# Patient Record
Sex: Male | Born: 1955 | Race: Black or African American | Hispanic: No | Marital: Single | Smoking: Current every day smoker
Health system: Southern US, Community
[De-identification: ages and names within clinical notes are randomized; demographics above are authoritative.]

## PROBLEM LIST (undated history)

## (undated) DIAGNOSIS — I10 Essential (primary) hypertension: Secondary | ICD-10-CM

## (undated) DIAGNOSIS — R9439 Abnormal result of other cardiovascular function study: Secondary | ICD-10-CM

---

## 1999-08-01 NOTE — ED Provider Notes (Signed)
[facility]                      EMERGENCY DEPARTMENT TREATMENT REPORT   NAME:  Cesar Todd, Cesar Todd   MR #:  [id number]   BILLING #: [id number]        DOS: 08/01/1999  TIME: 8:29 P   cc:   Primary Physician:   CHIEF COMPLAINT:  Recheck of left eye.   HISTORY OF PRESENT ILLNESS:  This is a 44 year old male who was seen in the   Emergency Department on 07/12 for apparent corneal abrasion of his left   eye.  He thought it might have been related to chemical exposure at work,   he does work for [employer], although he does not remember getting anything in   his eye, it just started hurting.  He was seen here and was diagnosed with   a corneal abrasion and put on Ilotycin ointment.  He was suppose to come   back for a recheck four days ago but was not able to get in at that time.   He presents now.  He states his eye is much better, he is able to see out   of it, it does not particularly hurt him at this time, and needs a note to   return to work.   REVIEW OF SYSTEMS:  See HPI, otherwise negative.  Denies any photophobia,   no drainage from the eye, has been using his ointment.   PHYSICAL EXAMINATION:   EYES:  The left eye is mildly injected.  Cornea is clear.  Pupil equal,   round, and reactive to light.   PROCEDURE NOTE:  Slit lamp was done.  Fluorescein stain was instilled and   revealed a small areas of stippling at about ten o'clock on the edge of the   cornea.  There was no foreign body present.  The anterior chamber is clear.   Visual acuity right eye 20/20; left eye 20/50.   IMPRESSION/MANAGEMENT PLAN:  This is a 44 year old male for recheck of his   left eye, still has some evidence of injury present.  He does need to   follow up with an ophthalmologist and he was strongly urged to so do by   myself and Dr. Katharine Look.  He will continue with the Ilotycin ointment.   He was given the name of Dr. Windell Moment and a phone number to call tomorrow   morning.   FINAL DIAGNOSIS:    1.  Resolving corneal abrasion.   DISPOSITION:  The patient was discharged home in stable condition, to   follow up as above.  The patient was evaluated by myself and Dr. Katharine Look, who agrees with the above assessment and plan.   Electronically Signed By:   Docia Furl, M.D. 08/06/1999 04:34   ____________________________   Docia Furl, M.D.   cd D:  08/01/1999 T:  08/04/1999  7:20 P   [id number]   Fara Chute, PA

## 1999-08-01 NOTE — ED Provider Notes (Signed)
[facility]                      EMERGENCY DEPARTMENT TREATMENT REPORT   NAME:  TESLA, BOCHICCHIO   MR #:  [id number]   BILLING #: [id number]        DOS: 07/27/1999  TIME: 3:33 P   cc:   Primary Physician:   The patient was seen at 1515 hours.   CHIEF COMPLAINT:  Left eye pain.   HISTORY OF PRESENT ILLNESS:  Mr. Solem is a 44 year old gentleman with a   three-day history of worsening pain to his left eye.  He has become very   photophobic.  The eye itself has become red.  He denies any visual changes,   headache, nausea, vomiting, fevers or chills.  He works for [employer].  He is afraid that some of the chemicals may have blown into his   face, but he denies any true exposure to chemicals.  He states that his   left eyelid has been swollen over the last day.   REVIEW OF SYSTEMS:   CONSTITUTIONAL:  No fever, chills, weight loss.   ENT: No sore throat, runny nose or other URI symptoms.   ENDOCRINE:  No diabetic symptoms.   NEUROLOGICAL:  No headaches, sensory or motor symptoms.   PAST MEDICAL HISTORY:   Negative.   MEDICATIONS:   Negative.   ALLERGIES:  Negative.   SOCIAL HISTORY:  He works for [employer].   PHYSICAL EXAMINATION:   VITAL SIGNS:  Blood pressure 136/80, pulse 90, respirations 18, temperature   98.5.   GENERAL APPEARANCE:  The patient appears well-developed and well-nourished.   Appearance and behavior are age and situation appropriate.   HEENT:  Eyes-Left conjunctiva is injected, right is normal.  The left upper   eyelid is edematous, but not erythematous.  The right lids are normal.   Pupils are equal, symmetric, and normally reactive bilaterally.  There is   white discharge at the left medial canthus.   Fundi:  Optic discs are   normal in appearance, no gross hemorrhages or exudates seen.     Ears/Nose:   Hearing is grossly intact to voice.  Internal and external examinations of   the ears are unremarkable.     Mouth/Throat:  Surfaces of the pharynx,    palate, and tongue are pink, moist, and without lesions.   NECK:  Supple, nontender, symmetrical, no masses or JVD, trachea midline,   thyroid not enlarged, nodular, or tender.   LYMPHATICS:   No cervical or submandibular lymphadenopathy palpated.   Slit lamp examination shows a large corneal abrasion over the left cornea   centrally with fluorescein uptake, no foreign bodies.   IMPRESSION/MANAGEMENT PLAN:   This is a new problem for this patient.   The   patient with a left corneal abrasion.  No foreign body on examination.   FINAL DIAGNOSIS:  Left eye corneal abrasion.   DISPOSITION:   The patient is discharged to home.  He is to return to the   Emergency Department if the eye is not significantly improved in 24 hours.   He was given a prescription for Ilotycin, 4 times a day for 5 days, and   Vicodin for pain, 1-2 q4-6h as needed for pain.   Electronically Signed By:   Octavia Bruckner, M.D. 08/05/1999 22:44   ____________________________   Octavia Bruckner, M.D.   jb D:  07/27/1999 T:  08/01/1999  8:56 P   [id number]

## 2008-12-22 MED ORDER — TRAMADOL 50 MG TAB
50 mg | ORAL_TABLET | Freq: Three times a day (TID) | ORAL | Status: AC | PRN
Start: 2008-12-22 — End: 2009-01-01

## 2008-12-22 NOTE — Progress Notes (Signed)
Fx wrist 12/17/2008.  In splint awaiting ortho appointment.  Out of pain med

## 2008-12-22 NOTE — Progress Notes (Signed)
Subjective:  Fractured wrist     PMH/PSH/SH/FH/Allergies and medications reviewed and noted in medical record.    Review of Systems - General ROS: negative  Musculoskeletal ROS: positive for - pain in arm - left and wrist - left    Objective:   BP 120/80   Pulse 68   Temp 97.7 ??F (36.5 ??C)   Resp 18    Physical Examination: General appearance - alert, well appearing, and in no distress and normal appearing weight  Mental status - alert, oriented to person, place, and time, normal mood, behavior, speech, dress, motor activity, and thought processes  Musculoskeletal - abnormal exam of left arm, arm is in a sling and is well immobilized  Assessment/ Plan:   Cesar Todd was seen today for wrist injury.    Diagnoses and associated orders for this visit:    Wrist fracture, closed    Ed (erectile dysfunction)    Other Orders  - tramadol (ULTRAM) 50 mg tablet; Take 1 Tab by mouth every eight (8) hours as needed for Pain for 10 days.        Medications ordered as noted above and risks/side effects/benefits discussed. Diagnosis and condition explained to patient and questions answered. Literature provided as noted above. Follow-up Disposition:  Return if symptoms worsen or fail to improve.

## 2009-01-05 MED ORDER — TRAMADOL 50 MG TAB
50 mg | ORAL_TABLET | Freq: Four times a day (QID) | ORAL | Status: AC | PRN
Start: 2009-01-05 — End: 2009-01-15

## 2009-01-05 NOTE — Progress Notes (Signed)
Subjective:  Fractured wrist     PMH/PSH/SH/FH/Allergies and medications reviewed and noted in medical record.    Review of Systems - General ROS: negative  Musculoskeletal ROS: positive for - pain in arm - left and wrist - left    Objective:   BP 128/82   Pulse 64   Resp 18    Physical Examination: General appearance - alert, well appearing, and in no distress and normal appearing weight  Mental status - alert, oriented to person, place, and time, normal mood, behavior, speech, dress, motor activity, and thought processes  Musculoskeletal - abnormal exam of left arm, arm is in a sling and is well immobilized  Assessment/ Plan:   Cesar Todd was seen today for wrist pain.    Diagnoses and associated orders for this visit:    Fracture of wrist  - tramadol (ULTRAM) 50 mg tablet; Take 1 Tab by mouth every six (6) hours as needed for Pain for 10 days.        Medications ordered as noted above and risks/side effects/benefits discussed. Diagnosis and condition explained to patient and questions answered. Literature provided as noted above. Follow-up Disposition: Not on File                         Subjective:  Pain in left wrist     PMH/PSH/SH/FH/Allergies and medications reviewed and noted in medical record.    Review of Systems - History obtained from the patient  General ROS: negative  Musculoskeletal ROS: positive for - fracture of the left wrist    Objective:   BP 128/82   Pulse 64   Resp 18    Physical Examination: General appearance - alert, well appearing, and in no distress, oriented to person, place, and time and normal appearing weight  Mental status - alert, oriented to person, place, and time, normal mood, behavior, speech, dress, motor activity, and thought processes  Musculoskeletal - cast to the left wrist.  Circulation is good.  Assessment/ Plan:   Cesar Todd was seen today for wrist pain.    Diagnoses and associated orders for this visit:    Fracture of wrist   - tramadol (ULTRAM) 50 mg tablet; Take 1 Tab by mouth every six (6) hours as needed for Pain for 10 days.        Medications ordered as noted above and risks/side effects/benefits discussed. Diagnosis and condition explained to patient and questions answered. Literature provided as noted above. Follow-up Disposition: Not on File

## 2009-01-05 NOTE — Progress Notes (Signed)
Requesting pain medication

## 2013-01-05 ENCOUNTER — Encounter

## 2013-02-05 NOTE — Progress Notes (Signed)
Did not call patient because of long distance number. I don not have long distance code at this time.

## 2017-05-20 ENCOUNTER — Emergency Department (HOSPITAL_COMMUNITY)
Admission: EM | Admit: 2017-05-20 | Discharge: 2017-05-20 | Disposition: A | Discharge status: Home / Self Care | Payer: Medicare Other | Attending: Emergency Medicine | Admitting: Emergency Medicine

## 2017-05-20 ENCOUNTER — Encounter (HOSPITAL_COMMUNITY): Payer: Self-pay

## 2017-05-20 ENCOUNTER — Other Ambulatory Visit: Payer: Self-pay

## 2017-05-20 DIAGNOSIS — M26602 Left temporomandibular joint disorder, unspecified: Secondary | ICD-10-CM | POA: Diagnosis not present

## 2017-05-20 DIAGNOSIS — M26622 Arthralgia of left temporomandibular joint: Secondary | ICD-10-CM

## 2017-05-20 DIAGNOSIS — R6884 Jaw pain: Secondary | ICD-10-CM | POA: Diagnosis present

## 2017-05-20 NOTE — ED Notes (Signed)
This NT brought pt back to room, pt communicated frustration with wait time and expressed that he wanted to leave or be seen by ED provider immediately. This NT explained to pt that the EDP would be with him as soon as possible. Pt encouraged to stay and chose to stay. Pt refused to change into gown and is resting in bed at this time.

## 2017-05-20 NOTE — ED Triage Notes (Signed)
Patient complains of left temporal headache x 2 weeks, denies trauma. denies blurred vision, no nausea, alert and oriented.

## 2017-05-20 NOTE — ED Provider Notes (Signed)
[facility] EMERGENCY DEPARTMENT Provider Note  CSN: [id number] Arrival date & time: 05/20/17 4098  Chief Complaint(s) Headache  HPI Curtis Griffin is a 62 y.o. male with no pertinent past medical history presents to the emergency department with left jaw pain for 2 weeks that was gradual onset and worsening since onset.   pain is aching in nature.  Exacerbated with chewing and biting down with the back of his teeth.  This is new for the patient.  Denies any fevers or chills.  No nausea or vomiting.  No abdominal pain.  No chest pain or shortness of breath.  Denies any ear pain.  Denies any dental pain.  HPI  Past Medical History History reviewed. No pertinent past medical history. There are no active problems to display for this patient.  Home Medication(s) Prior to Admission medications   Not on File                                                                                                                                    Past Surgical History History reviewed. No pertinent surgical history. Family History No family history on file.  Social History Social History   Tobacco Use  . Smoking status: Not on file  Substance Use Topics  . Alcohol use: Not on file  . Drug use: Not on file   Allergies Patient has no known allergies.  Review of Systems Review of Systems All other systems are reviewed and are negative for acute change except as noted in the HPI  Physical Exam Vital Signs  I have reviewed the triage vital signs BP (!) 134/94 (BP Location: Right Arm)   Pulse 87   Temp 98.1 F (36.7 C) (Oral)   Resp 18   SpO2 99%   Physical Exam  Constitutional: He is oriented to person, place, and time. He appears well-developed and well-nourished. No distress.  HENT:  Head: Normocephalic and atraumatic.    Right Ear: Tympanic membrane and external ear normal.  Left Ear: Tympanic membrane and external ear normal.  Nose: Nose normal.    Mouth/Throat: Mucous membranes are normal. No trismus in the jaw. Abnormal dentition.  No dental TTP. No temporal TTP  Eyes: Conjunctivae and EOM are normal. No scleral icterus.  Neck: Normal range of motion and phonation normal.  Cardiovascular: Normal rate and regular rhythm.  Pulmonary/Chest: Effort normal. No stridor. No respiratory distress.  Abdominal: He exhibits no distension.  Musculoskeletal: Normal range of motion. He exhibits no edema.  Neurological: He is alert and oriented to person, place, and time. He has normal strength. No cranial nerve deficit or sensory deficit.  Skin: He is not diaphoretic.  Psychiatric: He has a normal mood and affect. His behavior is normal.  Vitals reviewed.   ED Results and Treatments Labs (all labs ordered are listed, but only abnormal results are displayed) Labs Reviewed - No data to display  EKG  EKG Interpretation  Date/Time:    Ventricular Rate:    PR Interval:    QRS Duration:   QT Interval:    QTC Calculation:   R Axis:     Text Interpretation:        Radiology No results found. Pertinent labs & imaging results that were available during my care of the patient were reviewed by me and considered in my medical decision making (see chart for details).  Medications Ordered in ED Medications - No data to display                                                                                                                                  Procedures Procedures  (including critical care time)  Medical Decision Making / ED Course I have reviewed the nursing notes for this encounter and the patient's prior records (if available in EHR or on provided paperwork).     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  No evidence of dental etiology.  No evidence of acute otitis media.  Not suspicious for temporal  arteritis.  Non focal neuro exam. No recent head trauma. No fever. Doubt meningitis. Doubt intracranial bleed. Doubt IIH. No indication for imaging.   Patient is already on NSAIDs and muscle relaxers for chronic back pain.  Recommended continue use and [facility] either [facility] or [facility]y.    Final Clinical Impression(s) / ED Diagnoses Final diagnoses:  TMJ tenderness, left   Disposition: Discharge  Condition: Good  I have discussed the results, Dx and Tx plan with the patient who expressed understanding and agree(s) with the plan. Discharge instructions discussed at great length. The patient was given strict return precautions who verbalized understanding of the instructions. No further questions at time of discharge.    ED Discharge Orders    None       Follow Up: [facility] [address] [place] [address] [phone]  Schedule an appointment as soon as possible for a visit  in 5-7 days, If symptoms do not improve or  worsen      This chart was dictated using voice recognition software.  Despite best efforts to proofread,  errors can occur which can change the documentation meaning.   Nira Conn, MD 05/20/17 [phone]

## 2017-05-20 NOTE — ED Notes (Signed)
Discussed discharge with pt ; no questions , understands to follow up as ordered

## 2017-09-24 ENCOUNTER — Emergency Department (HOSPITAL_COMMUNITY): Payer: Medicare Other

## 2017-09-24 ENCOUNTER — Encounter (HOSPITAL_COMMUNITY): Payer: Self-pay | Admitting: Emergency Medicine

## 2017-09-24 ENCOUNTER — Encounter (HOSPITAL_COMMUNITY): Payer: Self-pay | Admitting: *Deleted

## 2017-09-24 ENCOUNTER — Emergency Department (HOSPITAL_COMMUNITY)
Admission: EM | Admit: 2017-09-24 | Discharge: 2017-09-24 | Disposition: A | Discharge status: Home / Self Care | Payer: Medicare Other | Attending: Emergency Medicine | Admitting: Emergency Medicine

## 2017-09-24 ENCOUNTER — Ambulatory Visit (INDEPENDENT_AMBULATORY_CARE_PROVIDER_SITE_OTHER)
Admission: EM | Admit: 2017-09-24 | Discharge: 2017-09-24 | Disposition: A | Discharge status: Home / Self Care | Payer: Medicare Other | Source: Home / Self Care | Attending: Family Medicine | Admitting: Family Medicine

## 2017-09-24 ENCOUNTER — Other Ambulatory Visit: Payer: Self-pay

## 2017-09-24 DIAGNOSIS — Y929 Unspecified place or not applicable: Secondary | ICD-10-CM | POA: Diagnosis not present

## 2017-09-24 DIAGNOSIS — S0993XA Unspecified injury of face, initial encounter: Secondary | ICD-10-CM | POA: Diagnosis not present

## 2017-09-24 DIAGNOSIS — H1132 Conjunctival hemorrhage, left eye: Secondary | ICD-10-CM | POA: Insufficient documentation

## 2017-09-24 DIAGNOSIS — Y999 Unspecified external cause status: Secondary | ICD-10-CM | POA: Diagnosis not present

## 2017-09-24 DIAGNOSIS — S0512XA Contusion of eyeball and orbital tissues, left eye, initial encounter: Secondary | ICD-10-CM

## 2017-09-24 DIAGNOSIS — S0592XA Unspecified injury of left eye and orbit, initial encounter: Secondary | ICD-10-CM | POA: Diagnosis not present

## 2017-09-24 DIAGNOSIS — H5712 Ocular pain, left eye: Secondary | ICD-10-CM | POA: Diagnosis not present

## 2017-09-24 DIAGNOSIS — S058X2A Other injuries of left eye and orbit, initial encounter: Secondary | ICD-10-CM

## 2017-09-24 DIAGNOSIS — R7989 Other specified abnormal findings of blood chemistry: Secondary | ICD-10-CM

## 2017-09-24 DIAGNOSIS — Y939 Activity, unspecified: Secondary | ICD-10-CM | POA: Diagnosis not present

## 2017-09-24 DIAGNOSIS — R51 Headache: Secondary | ICD-10-CM | POA: Diagnosis present

## 2017-09-24 LAB — BASIC METABOLIC PANEL
ANION GAP: 13 (ref 5–15)
BUN: 21 mg/dL — ABNORMAL HIGH (ref 6–20)
CHLORIDE: 108 mmol/L (ref 98–111)
CO2: 24 mmol/L (ref 22–32)
CREATININE: 2.85 mg/dL — AB (ref 0.61–1.24)
Calcium: 9.5 mg/dL (ref 8.9–10.3)
GFR calc non Af Amer: 23 mL/min — ABNORMAL LOW (ref >60)
GFR, EST AFRICAN AMERICAN: 26 mL/min — AB (ref >60)
Glucose, Bld: 98 mg/dL (ref 70–99)
POTASSIUM: 4.1 mmol/L (ref 3.5–5.1)
Sodium: 145 mmol/L (ref 135–145)

## 2017-09-24 LAB — CBC
HEMATOCRIT: 46.4 % (ref 39.0–52.0)
Hemoglobin: 15.1 g/dL (ref 13.0–17.0)
MCH: 29.1 pg (ref 26.0–34.0)
MCHC: 32.5 g/dL (ref 30.0–36.0)
MCV: 89.4 fL (ref 78.0–100.0)
PLATELETS: 275 10*3/uL (ref 150–400)
RBC: 5.19 MIL/uL (ref 4.22–5.81)
RDW: 13.3 % (ref 11.5–15.5)
WBC: 6.5 10*3/uL (ref 4.0–10.5)

## 2017-09-24 LAB — URINALYSIS, ROUTINE W REFLEX MICROSCOPIC
Bilirubin Urine: NEGATIVE
Glucose, UA: NEGATIVE mg/dL
Ketones, ur: NEGATIVE mg/dL
Leukocytes, UA: NEGATIVE
Nitrite: NEGATIVE
PROTEIN: NEGATIVE mg/dL
SPECIFIC GRAVITY, URINE: 1.004 — AB (ref 1.005–1.030)
pH: 5 (ref 5.0–8.0)

## 2017-09-24 MED ORDER — OXYCODONE HCL 5 MG PO TABS: 5.0000 mg | ORAL_TABLET | Freq: Four times a day (QID) | ORAL | 0 refills | Status: DC | PRN

## 2017-09-24 MED ORDER — ERYTHROMYCIN 5 MG/GM OP OINT
1.0000 "application " | TOPICAL_OINTMENT | Freq: Once | OPHTHALMIC | Status: AC
  Administered 2017-09-24: 1 via OPHTHALMIC
  Filled 2017-09-24: qty 3.5

## 2017-09-24 MED ORDER — FLUORESCEIN SODIUM 1 MG OP STRP
1.0000 | ORAL_STRIP | Freq: Once | OPHTHALMIC | Status: AC
  Administered 2017-09-24: 1 via OPHTHALMIC
  Filled 2017-09-24: qty 1

## 2017-09-24 MED ORDER — SODIUM CHLORIDE 0.9 % IV BOLUS
1000.0000 mL | Freq: Once | INTRAVENOUS | Status: AC
  Administered 2017-09-24: 1000 mL via INTRAVENOUS

## 2017-09-24 MED ORDER — MORPHINE SULFATE (PF) 4 MG/ML IV SOLN
4.0000 mg | Freq: Once | INTRAVENOUS | Status: AC
  Administered 2017-09-24: 4 mg via INTRAVENOUS
  Filled 2017-09-24: qty 1

## 2017-09-24 MED ORDER — ONDANSETRON HCL 4 MG/2ML IJ SOLN
4.0000 mg | Freq: Once | INTRAMUSCULAR | Status: AC
  Administered 2017-09-24: 4 mg via INTRAVENOUS
  Filled 2017-09-24: qty 2

## 2017-09-24 MED ORDER — TETRACAINE HCL 0.5 % OP SOLN
2.0000 [drp] | Freq: Once | OPHTHALMIC | Status: AC
  Administered 2017-09-24: 2 [drp] via OPHTHALMIC
  Filled 2017-09-24: qty 4

## 2017-09-24 NOTE — ED Notes (Signed)
Patient returned from Xray and CT. 
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
## 2017-09-24 NOTE — ED Notes (Signed)
Patient transported to X-ray 
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
## 2017-09-24 NOTE — Discharge Instructions (Signed)
Your imaging shows no evidence of fracture surrounding your eye but you do have a contusion as well as an abrasion over your sclera, subconjunctival bleeding should improve over time.  You will need to use erythromycin eye ointment 4 times daily.  You can apply ice to the eye to help with swelling and discomfort and take prescribed pain medication as directed.  Will need to follow-up with Dr. Allena Katz with ophthalmology for recheck to ensure your eye is healing appropriately.  Your kidney function was elevated today, this should improve with fluids, you were given them to the IV today please make sure you are drinking plenty of water you will need to have this rechecked within 1 week by your primary care provider.  Return for increasing eye pain, vision changes, fevers, worsening swelling or any other new or concerning symptoms.

## 2017-10-12 ENCOUNTER — Emergency Department (HOSPITAL_COMMUNITY)
Admission: EM | Admit: 2017-10-12 | Discharge: 2017-10-12 | Disposition: A | Discharge status: Home / Self Care | Payer: Medicare Other | Attending: Emergency Medicine | Admitting: Emergency Medicine

## 2017-10-12 ENCOUNTER — Encounter (HOSPITAL_COMMUNITY): Payer: Self-pay | Admitting: Emergency Medicine

## 2017-10-12 ENCOUNTER — Emergency Department (HOSPITAL_COMMUNITY): Payer: Medicare Other

## 2017-10-12 ENCOUNTER — Other Ambulatory Visit: Payer: Self-pay

## 2017-10-12 DIAGNOSIS — R52 Pain, unspecified: Secondary | ICD-10-CM

## 2017-10-12 DIAGNOSIS — M25551 Pain in right hip: Secondary | ICD-10-CM

## 2017-10-12 DIAGNOSIS — Z79899 Other long term (current) drug therapy: Secondary | ICD-10-CM | POA: Diagnosis not present

## 2017-10-12 DIAGNOSIS — H5712 Ocular pain, left eye: Secondary | ICD-10-CM | POA: Insufficient documentation

## 2017-10-12 MED ORDER — TRAMADOL HCL 50 MG PO TABS: 50.0000 mg | ORAL_TABLET | Freq: Four times a day (QID) | ORAL | 0 refills | Status: DC | PRN

## 2017-10-12 MED ORDER — KETOROLAC TROMETHAMINE 60 MG/2ML IM SOLN
60.0000 mg | Freq: Once | INTRAMUSCULAR | Status: AC
  Administered 2017-10-12: 60 mg via INTRAMUSCULAR
  Filled 2017-10-12: qty 2

## 2017-10-12 MED ORDER — IBUPROFEN 800 MG PO TABS: 800.0000 mg | ORAL_TABLET | Freq: Four times a day (QID) | ORAL | 0 refills | Status: DC | PRN
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
## 2017-10-12 NOTE — ED Notes (Signed)
Pt stable, ambulatory, and verbalizes understanding of d/c instructions.  

## 2017-10-12 NOTE — Discharge Instructions (Addendum)
Your hip x-rays were normal. Follow up with the eye doctor you were referred to. Ice and heat on the area that is sore.

## 2017-10-12 NOTE — ED Notes (Signed)
Patient transported to X-ray 

## 2019-08-19 ENCOUNTER — Encounter (HOSPITAL_COMMUNITY): Payer: Self-pay

## 2019-08-19 ENCOUNTER — Ambulatory Visit (HOSPITAL_COMMUNITY)
Admission: EM | Admit: 2019-08-19 | Discharge: 2019-08-19 | Disposition: A | Discharge status: Home / Self Care | Payer: Medicare Other | Attending: Family Medicine | Admitting: Family Medicine

## 2019-08-19 ENCOUNTER — Other Ambulatory Visit: Payer: Self-pay

## 2019-08-19 DIAGNOSIS — R609 Edema, unspecified: Secondary | ICD-10-CM | POA: Diagnosis not present

## 2019-08-19 MED ORDER — MEDICAL COMPRESSION STOCKINGS MISC: 0 refills | Status: AC

## 2019-08-19 NOTE — Discharge Instructions (Signed)
Use of compression stockings.  Elevate your legs when able, especially at night or if noticeable increase in swelling.  Drink plenty of water, limit salt in your diet.  Please follow up with your primary care provider for recheck of your symptoms in the next month

## 2019-08-19 NOTE — ED Triage Notes (Signed)
Pt is here with ankles/legs swelling that he noticed this morning, pt has not taken anything to relieve discomfort.

## 2019-08-21 NOTE — ED Provider Notes (Signed)
[facility]    CSN: [id number] Arrival date & time: 08/19/19  1833      History   Chief Complaint Chief Complaint  Patient presents with  . ankles/legs swollen    HPI Curtis Griffin is a 64 y.o. male.   Curtis Griffin presents with complaints of swelling to lower legs which he noted yesterday, worse this morning. No known trigger. They have a numbness sensation and some discomfort. No injury. Normal activity yesterday in the home, no over exertion or on feet out door activity prior to onset. Hasn't elevated his legs. He is not diabetic. No shortness of breath , cough or chest pain . Denies any previous similar. No previous CHF.    ROS per HPI, negative if not otherwise mentioned.      History reviewed. No pertinent past medical history.  There are no problems to display for this patient.   History reviewed. No pertinent surgical history.     Home Medications    Prior to Admission medications   Medication Sig Start Date End Date Taking? Authorizing Provider  albuterol (VENTOLIN HFA) 108 (90 Base) MCG/ACT inhaler Inhale into the lungs. 12/25/15  Yes [provider]  azithromycin (ZITHROMAX) 250 MG tablet Take 2 tablets by mouth on day 1, then take 1 tablet daily on days 2-5 12/25/15  Yes [provider]  HYDROcodone-acetaminophen (NORCO/VICODIN) 5-325 MG tablet Take by mouth. 12/26/15  Yes [provider]  ibuprofen (ADVIL) 800 MG tablet Take by mouth. 12/02/15  Yes [provider]  methocarbamol (ROBAXIN) 500 MG tablet Take by mouth. 12/26/15  Yes [provider]  cyclobenzaprine (FLEXERIL) 10 MG tablet Take by mouth.    [provider]  Elastic Bandages & Supports (MEDICAL COMPRESSION STOCKINGS) MISC Wear daily and as needed for swelling of lower legs 08/19/19   Linus Mako B, NP  hydrochlorothiazide (HYDRODIURIL) 25 MG tablet Take by mouth.    [provider]  ibuprofen (ADVIL,MOTRIN) 800  MG tablet Take 1 tablet (800 mg total) by mouth every 6 (six) hours as needed. 10/12/17   Lawyer, Cristal Deer, PA-C  oxyCODONE (ROXICODONE) 5 MG immediate release tablet Take 1 tablet (5 mg total) by mouth every 6 (six) hours as needed for severe pain. 09/24/17   Dartha Lodge, PA-C  sildenafil (VIAGRA) 100 MG tablet Take 100 mg by mouth as needed. 07/17/19   [provider]  traMADol (ULTRAM) 50 MG tablet Take 1 tablet (50 mg total) by mouth every 6 (six) hours as needed for severe pain. 10/12/17   Charlestine Night, PA-C    Family History Family History  Problem Relation Age of Onset  . Healthy Mother   . Healthy Father     Social History Social History   Tobacco Use  . Smoking status: Unknown If Ever Smoked  . Smokeless tobacco: Never Used  Vaping Use  . Vaping Use: Never assessed  Substance Use Topics  . Alcohol use: Not Currently  . Drug use: Not Currently     Allergies   Patient has no known allergies.   Review of Systems Review of Systems   Physical Exam Triage Vital Signs ED Triage Vitals  Enc Vitals Group     BP 08/19/19 1932 128/85     Pulse Rate 08/19/19 1932 84     Resp 08/19/19 1932 18     Temp 08/19/19 1932 98.3 F (36.8 C)     Temp Source 08/19/19 1932 Oral     SpO2  08/19/19 1932 95 %     Weight 08/19/19 1930 240 lb (108.9 kg)     Height --      Head Circumference --      Peak Flow --      Pain Score 08/19/19 1929 0     Pain Loc --      Pain Edu? --      Excl. in GC? --    No data found.  Updated Vital Signs BP 128/85 (BP Location: Right Arm)   Pulse 84   Temp 98.3 F (36.8 C) (Oral)   Resp 18   Wt 240 lb (108.9 kg)   SpO2 95%   Visual Acuity Right Eye Distance:   Left Eye Distance:   Bilateral Distance:    Right Eye Near:   Left Eye Near:    Bilateral Near:     Physical Exam Constitutional:      Appearance: He is well-developed.  Cardiovascular:     Rate and Rhythm: Normal rate.     Heart sounds: Normal heart  sounds.     Comments: Trace edema to bilateral lower extremities; non pitting; no redness warmth or joint swelling; ambulatory without difficulty  Pulmonary:     Effort: Pulmonary effort is normal.     Breath sounds: Normal breath sounds.  Skin:    General: Skin is warm and dry.  Neurological:     Mental Status: He is alert and oriented to person, place, and time.      UC Treatments / Results  Labs (all labs ordered are listed, but only abnormal results are displayed) Labs Reviewed - No data to display  EKG   Radiology No results found.  Procedures Procedures (including critical care time)  Medications Ordered in UC Medications - No data to display  Initial Impression / Assessment and Plan / UC Course  I have reviewed the triage vital signs and the nursing notes.  Pertinent labs & imaging results that were available during my care of the patient were reviewed by me and considered in my medical decision making (see chart for details).     Trace edema to lower extremities for the past day. Non pitting. No indication of cellulitis. No chest pain , no calf pain. Supportive management recommended at this time. Encouraged follow up with PCP for recheck if no improvement. Patient verbalized understanding and agreeable to plan.  Ambulatory out of clinic without difficulty.     Final Clinical Impressions(s) / UC Diagnoses   Final diagnoses:  Peripheral edema     Discharge Instructions     Use of compression stockings.  Elevate your legs when able, especially at night or if noticeable increase in swelling.  Drink plenty of water, limit salt in your diet.  Please follow up with your primary care provider for recheck of your symptoms in the next month    ED Prescriptions    Medication Sig Dispense Auth. Provider   Elastic Bandages & Supports (MEDICAL COMPRESSION STOCKINGS) MISC Wear daily and as needed for swelling of lower legs 1 each Jeraline Marcinek, Barron Alvine, NP     PDMP not  reviewed this encounter.   Georgetta Haber, NP 08/21/19 1042

## 2020-07-14 ENCOUNTER — Emergency Department (HOSPITAL_COMMUNITY): Payer: Medicare Other

## 2020-07-14 ENCOUNTER — Encounter (HOSPITAL_COMMUNITY): Payer: Self-pay | Admitting: Emergency Medicine

## 2020-07-14 ENCOUNTER — Observation Stay (HOSPITAL_COMMUNITY)
Admission: EM | Admit: 2020-07-14 | Discharge: 2020-07-14 | Disposition: A | Discharge status: Home / Self Care | Payer: Medicare Other | Attending: Internal Medicine | Admitting: Internal Medicine

## 2020-07-14 ENCOUNTER — Other Ambulatory Visit: Payer: Self-pay

## 2020-07-14 DIAGNOSIS — Z20822 Contact with and (suspected) exposure to covid-19: Secondary | ICD-10-CM | POA: Diagnosis not present

## 2020-07-14 DIAGNOSIS — R404 Transient alteration of awareness: Secondary | ICD-10-CM | POA: Diagnosis not present

## 2020-07-14 DIAGNOSIS — R600 Localized edema: Secondary | ICD-10-CM | POA: Insufficient documentation

## 2020-07-14 DIAGNOSIS — R5383 Other fatigue: Secondary | ICD-10-CM | POA: Diagnosis not present

## 2020-07-14 DIAGNOSIS — R825 Elevated urine levels of drugs, medicaments and biological substances: Secondary | ICD-10-CM | POA: Insufficient documentation

## 2020-07-14 DIAGNOSIS — R6889 Other general symptoms and signs: Secondary | ICD-10-CM | POA: Diagnosis not present

## 2020-07-14 DIAGNOSIS — R55 Syncope and collapse: Secondary | ICD-10-CM | POA: Diagnosis not present

## 2020-07-14 DIAGNOSIS — F17210 Nicotine dependence, cigarettes, uncomplicated: Secondary | ICD-10-CM | POA: Diagnosis present

## 2020-07-14 DIAGNOSIS — Z79899 Other long term (current) drug therapy: Secondary | ICD-10-CM | POA: Diagnosis not present

## 2020-07-14 DIAGNOSIS — F1410 Cocaine abuse, uncomplicated: Secondary | ICD-10-CM | POA: Diagnosis present

## 2020-07-14 DIAGNOSIS — Z743 Need for continuous supervision: Secondary | ICD-10-CM | POA: Diagnosis not present

## 2020-07-14 DIAGNOSIS — N400 Enlarged prostate without lower urinary tract symptoms: Secondary | ICD-10-CM | POA: Diagnosis present

## 2020-07-14 DIAGNOSIS — I499 Cardiac arrhythmia, unspecified: Secondary | ICD-10-CM | POA: Diagnosis not present

## 2020-07-14 DIAGNOSIS — R0902 Hypoxemia: Secondary | ICD-10-CM | POA: Diagnosis not present

## 2020-07-14 HISTORY — DX: Nicotine dependence, cigarettes, uncomplicated: F17.210

## 2020-07-14 HISTORY — DX: Benign prostatic hyperplasia without lower urinary tract symptoms: N40.0

## 2020-07-14 HISTORY — DX: Cocaine abuse, uncomplicated: F14.10

## 2020-07-14 LAB — COMPREHENSIVE METABOLIC PANEL
ALT: 32 U/L (ref 0–44)
AST: 42 U/L — ABNORMAL HIGH (ref 15–41)
Albumin: 3.4 g/dL — ABNORMAL LOW (ref 3.5–5.0)
Alkaline Phosphatase: 67 U/L (ref 38–126)
Anion gap: 6 (ref 5–15)
BUN: 13 mg/dL (ref 8–23)
CO2: 22 mmol/L (ref 22–32)
Calcium: 8.3 mg/dL — ABNORMAL LOW (ref 8.9–10.3)
Chloride: 106 mmol/L (ref 98–111)
Creatinine, Ser: 1.16 mg/dL (ref 0.61–1.24)
GFR, Estimated: >60 mL/min (ref >60)
Glucose, Bld: 104 mg/dL — ABNORMAL HIGH (ref 70–99)
Potassium: 4.1 mmol/L (ref 3.5–5.1)
Sodium: 134 mmol/L — ABNORMAL LOW (ref 135–145)
Total Bilirubin: 0.5 mg/dL (ref 0.3–1.2)
Total Protein: 6.2 g/dL — ABNORMAL LOW (ref 6.5–8.1)

## 2020-07-14 LAB — CBC WITH DIFFERENTIAL/PLATELET
Abs Immature Granulocytes: 0.02 10*3/uL (ref 0.00–0.07)
Basophils Absolute: 0 10*3/uL (ref 0.0–0.1)
Basophils Relative: 0 %
Eosinophils Absolute: 0.1 10*3/uL (ref 0.0–0.5)
Eosinophils Relative: 1 %
HCT: 41.9 % (ref 39.0–52.0)
Hemoglobin: 13.8 g/dL (ref 13.0–17.0)
Immature Granulocytes: 0 %
Lymphocytes Relative: 22 %
Lymphs Abs: 1.3 10*3/uL (ref 0.7–4.0)
MCH: 29.4 pg (ref 26.0–34.0)
MCHC: 32.9 g/dL (ref 30.0–36.0)
MCV: 89.1 fL (ref 80.0–100.0)
Monocytes Absolute: 1 10*3/uL (ref 0.1–1.0)
Monocytes Relative: 16 %
Neutro Abs: 3.6 10*3/uL (ref 1.7–7.7)
Neutrophils Relative %: 61 %
Platelets: 259 10*3/uL (ref 150–400)
RBC: 4.7 MIL/uL (ref 4.22–5.81)
RDW: 13.8 % (ref 11.5–15.5)
WBC: 6 10*3/uL (ref 4.0–10.5)
nRBC: 0 % (ref 0.0–0.2)

## 2020-07-14 LAB — RAPID URINE DRUG SCREEN, HOSP PERFORMED
Amphetamines: NOT DETECTED
Barbiturates: NOT DETECTED
Benzodiazepines: NOT DETECTED
Cocaine: POSITIVE — AB
Opiates: NOT DETECTED
Tetrahydrocannabinol: NOT DETECTED

## 2020-07-14 LAB — TSH: TSH: 1.094 u[IU]/mL (ref 0.350–4.500)

## 2020-07-14 LAB — TROPONIN I (HIGH SENSITIVITY)
Troponin I (High Sensitivity): 13 ng/L (ref <18)
Troponin I (High Sensitivity): 13 ng/L (ref <18)

## 2020-07-14 LAB — MAGNESIUM: Magnesium: 2 mg/dL (ref 1.7–2.4)

## 2020-07-14 LAB — BRAIN NATRIURETIC PEPTIDE: B Natriuretic Peptide: 25.6 pg/mL (ref 0.0–100.0)

## 2020-07-14 MED ORDER — ENOXAPARIN SODIUM 40 MG/0.4ML IJ SOSY: 40.0000 mg | PREFILLED_SYRINGE | INTRAMUSCULAR | Status: DC

## 2020-07-14 MED ORDER — POLYETHYLENE GLYCOL 3350 17 G PO PACK: 17.0000 g | PACK | Freq: Every day | ORAL | Status: DC | PRN

## 2020-07-14 MED ORDER — ACETAMINOPHEN 325 MG PO TABS: 650.0000 mg | ORAL_TABLET | Freq: Four times a day (QID) | ORAL | Status: DC | PRN

## 2020-07-14 MED ORDER — ACETAMINOPHEN 650 MG RE SUPP
650.0000 mg | Freq: Four times a day (QID) | RECTAL | Status: DC | PRN
Start: 2020-07-14 — End: 2020-07-15

## 2020-07-14 MED ORDER — BENZONATATE 100 MG PO CAPS: 100.0000 mg | ORAL_CAPSULE | Freq: Three times a day (TID) | ORAL | Status: DC | PRN

## 2020-07-14 MED ORDER — ONDANSETRON HCL 4 MG PO TABS: 4.0000 mg | ORAL_TABLET | Freq: Four times a day (QID) | ORAL | Status: DC | PRN

## 2020-07-14 MED ORDER — DULOXETINE HCL 60 MG PO CPEP: 60.0000 mg | ORAL_CAPSULE | Freq: Every day | ORAL | Status: DC

## 2020-07-14 MED ORDER — ONDANSETRON HCL 4 MG/2ML IJ SOLN: 4.0000 mg | Freq: Four times a day (QID) | INTRAMUSCULAR | Status: DC | PRN

## 2020-07-14 MED ORDER — FINASTERIDE 5 MG PO TABS: 5.0000 mg | ORAL_TABLET | Freq: Every morning | ORAL | Status: DC

## 2020-07-14 NOTE — H&P (Signed)
History and Physical    Curtis GanserLarry D Holtsclaw UUV:[id number] DOB: 10/16/1955 DOA: 07/14/2020  [facility]  Patient coming from: Home   Chief Complaint:  Chief Complaint  Patient presents with   Loss of Consciousness     HPI:    65 year old male with past medical history of nicotine dependence, benign prostatic hyperplasia, depression and cocaine use who presents [facility] department brought in by EMS after experiencing an episode of loss of consciousness.  Today, the patient [place] [employer] [place] when he suddenly experienced sudden intense lightheadedness.  Patient denies any associated palpitations or chest pain.  Patient suddenly then lost consciousness and the next thing he remembers is waking up on the ground being woken up by a fellow shopper.  Patient denies any associated tongue biting self urination or self defecation.  Patient is uncertain how long he lost consciousness.  Patient believes he may have hit his head as he fell but did not experience any obvious injury to his face or scalp.    Patient explains that for at least the last several months he has been experiencing similar episodes of loss of consciousness.  Patient states that these episodes occur once every few weeks, sometimes several times monthly.  He states that most episodes occur under the same circumstances.  Patient denies any antihypertensive use although patient does admit to ongoing cocaine use, last use yesterday.  Patient denies any history of alcohol use.  EMS was contacted patient was promptly evaluated [facility] and [facility] for evaluation.  Upon evaluation in the emergency department EKG revealed significant T wave inversions throughout the precordial leads concerning for underlying coronary disease or cardiomyopathy.  Initial troponin was unremarkable.  Chest x-ray unremarkable.  Urine toxicology screen positive  for cocaine.  Due to suspected undiagnosed heart disease and recurrent bouts of syncope hospitalist group has been called to assess the patient for admission to the hospital.   Review of Systems:   Review of Systems  Respiratory:  Positive for cough.   Cardiovascular:  Positive for orthopnea, leg swelling and PND.  Neurological:  Positive for loss of consciousness.  All other systems reviewed and are negative.  Past Medical History:  Diagnosis Date   Benign prostate hyperplasia 07/14/2020   Cocaine abuse (HCC) 07/14/2020   Nicotine dependence, cigarettes, uncomplicated 07/14/2020    History reviewed. No pertinent surgical history.   reports that he has been smoking cigarettes. He has been smoking an average of 0.25 packs per day. He has never used smokeless tobacco. He reports previous alcohol use. He reports previous drug use.  No Known Allergies  Family History  Problem Relation Age of Onset   Healthy Mother    Healthy Father      Prior to Admission medications   Medication Sig Start Date End Date Taking? Authorizing Provider  DULoxetine (CYMBALTA) 60 MG capsule Take by mouth. 02/12/20  Yes [provider]  finasteride (PROSCAR) 5 MG tablet Take 1 tablet by mouth every morning. 11/09/19  Yes [provider]  ibuprofen (ADVIL,MOTRIN) 800 MG tablet Take 1 tablet (800 mg total) by mouth every 6 (six) hours as needed. 10/12/17  Yes Lawyer, Cristal Deerhristopher, PA-C  carbamide peroxide (DEBROX) 6.5 % OTIC solution Place 5-10 drops into both ears See admin instructions. Tid x 7 days Patient not taking: No sig reported 03/15/20   [provider]  Elastic Bandages & Supports (MEDICAL COMPRESSION STOCKINGS) MISC Wear  daily and as needed for swelling of lower legs Patient not taking: No sig reported 08/19/19   Linus Mako B, NP  naproxen (NAPROSYN) 250 MG tablet Take 1 tablet by mouth 3 (three) times daily. Patient not taking: Reported on 07/14/2020 08/06/12   [provider]  oxyCODONE (ROXICODONE) 5 MG immediate release tablet Take 1 tablet (5 mg total) by mouth every 6 (six) hours as needed for severe pain. Patient not taking: No sig reported 09/24/17   Dartha Lodge, PA-C  traMADol (ULTRAM) 50 MG tablet Take 1 tablet (50 mg total) by mouth every 6 (six) hours as needed for severe pain. Patient not taking: No sig reported 10/12/17   Charlestine Night, PA-C    Physical Exam: Vitals:   07/14/20 2100 07/14/20 2115 07/14/20 2145 07/14/20 2200  BP: (!) 147/81 (!) 148/85 133/77 138/83  Pulse: 73 70 86 76  Resp: 14 18 12  (!) 22  Temp:      TempSrc:      SpO2: 97% 96% 98% 98%    Constitutional: Awake alert and oriented x3, no associated distress.   Skin: no rashes, no lesions, good skin turgor noted. Eyes: Pupils are equally reactive to light.  No evidence of scleral icterus or conjunctival pallor.  ENMT: Moist mucous membranes noted.  Posterior pharynx clear of any exudate or lesions.   Neck: normal, supple, no masses, no thyromegaly.  No evidence of jugular venous distension.   Respiratory: clear to auscultation bilaterally, no wheezing, no crackles. Normal respiratory effort. No accessory muscle use.  Cardiovascular: Regular rate and rhythm, no murmurs / rubs / gallops. No extremity edema. 2+ pedal pulses. No carotid bruits.  Chest:   Nontender without crepitus or deformity.   Back:   Nontender without crepitus or deformity. Abdomen: Abdomen is protuberant but soft and nontender.  No evidence of intra-abdominal masses.  Positive bowel sounds noted in all quadrants.   Musculoskeletal: No joint deformity upper and lower extremities. Good ROM, no contractures. Normal muscle tone.  Neurologic: CN 2-12 grossly intact. Sensation intact.  Patient moving all 4 extremities spontaneously.  Patient is following all commands.  Patient is responsive to verbal stimuli.   Psychiatric: Patient exhibits normal mood with appropriate affect.  Patient seems to  possess insight as to their current situation.     Labs on Admission: I have personally reviewed following labs and imaging studies -   CBC: Recent Labs  Lab 07/14/20 1639  WBC 6.0  NEUTROABS 3.6  HGB 13.8  HCT 41.9  MCV 89.1  PLT 259   Basic Metabolic Panel: Recent Labs  Lab 07/14/20 1639  NA 134*  K 4.1  CL 106  CO2 22  GLUCOSE 104*  BUN 13  CREATININE 1.16  CALCIUM 8.3*   GFR: CrCl cannot be calculated (Unknown ideal weight.). Liver Function Tests: Recent Labs  Lab 07/14/20 1639  AST 42*  ALT 32  ALKPHOS 67  BILITOT 0.5  PROT 6.2*  ALBUMIN 3.4*   No results for input(s): LIPASE, AMYLASE in the last 168 hours. No results for input(s): AMMONIA in the last 168 hours. Coagulation Profile: No results for input(s): INR, PROTIME in the last 168 hours. Cardiac Enzymes: No results for input(s): CKTOTAL, CKMB, CKMBINDEX, TROPONINI in the last 168 hours. BNP (last 3 results) No results for input(s): PROBNP in the last 8760 hours. HbA1C: No results for input(s): HGBA1C in the last 72 hours. CBG: No results for input(s): GLUCAP in the last 168 hours. Lipid Profile:  No results for input(s): CHOL, HDL, LDLCALC, TRIG, CHOLHDL, LDLDIRECT in the last 72 hours. Thyroid Function Tests: Recent Labs    07/14/20 1639  TSH 1.094   Anemia Panel: No results for input(s): VITAMINB12, FOLATE, FERRITIN, TIBC, IRON, RETICCTPCT in the last 72 hours. Urine analysis:    Component Value Date/Time   COLORURINE STRAW (A) 09/24/2017 1602   APPEARANCEUR CLEAR 09/24/2017 1602   LABSPEC 1.004 (L) 09/24/2017 1602   PHURINE 5.0 09/24/2017 1602   GLUCOSEU NEGATIVE 09/24/2017 1602   HGBUR SMALL (A) 09/24/2017 1602   BILIRUBINUR NEGATIVE 09/24/2017 1602   KETONESUR NEGATIVE 09/24/2017 1602   PROTEINUR NEGATIVE 09/24/2017 1602   NITRITE NEGATIVE 09/24/2017 1602   LEUKOCYTESUR NEGATIVE 09/24/2017 1602    Radiological Exams on Admission - Personally Reviewed: CT Head Wo  Contrast  Result Date: 07/14/2020 CLINICAL DATA:  Syncope. EXAM: CT HEAD WITHOUT CONTRAST TECHNIQUE: Contiguous axial images were obtained from the base of the skull through the vertex without intravenous contrast. COMPARISON:  None. FINDINGS: Brain: No evidence of acute infarction, hemorrhage, hydrocephalus, extra-axial collection or mass lesion/mass effect. Vascular: No hyperdense vessel or unexpected calcification. Skull: Normal. Negative for fracture or focal lesion. Sinuses/Orbits: No acute finding. Other: Mild left frontal scalp soft tissue swelling is seen. IMPRESSION: No acute intracranial abnormality. Electronically Signed   By: Aram Candela M.D.   On: 07/14/2020 18:44   DG Chest Portable 1 View  Result Date: 07/14/2020 CLINICAL DATA:  Syncope. EXAM: PORTABLE CHEST 1 VIEW COMPARISON:  September 24, 2017 FINDINGS: Multiple sternal wires are seen. There is no evidence of acute infiltrate, pleural effusion or pneumothorax. The cardiac silhouette is mildly enlarged and unchanged in size. The visualized skeletal structures are unremarkable. IMPRESSION: Stable exam without active cardiopulmonary disease. Electronically Signed   By: Aram Candela M.D.   On: 07/14/2020 18:11    EKG: Personally reviewed.  Rhythm is normal sinus rhythm with heart rate of 65 bpm.  Significant T wave inversions and ST segment depressions noted throughout the precordial leads, no prior EKG for comparison.  Assessment/Plan Principal Problem:   Syncope  Patient presenting with at least a several month history of intermittent syncope This is in the setting of longstanding cocaine use as well as longstanding complaints of intermittent bilateral lower extremity edema and what sounds to be paroxysmal nocturnal dyspnea with chronic cough Biochemical work-up currently unremarkable however EKG is concerning for underlying cardiomyopathy In this patient with longstanding cocaine abuse and concerned about an undiagnosed  cardiomyopathy as a cause for the patient's recurrent bouts of syncope. Monitoring patient overnight on telemetry Cycle cardiac enzymes Obtaining orthostatic vital signs Review of home medication list reveals no obvious pharmacologic cause of the patient's episodes. BNP pending, echocardiogram ordered for the morning Based on our work-up we will consider cardiology consultation tomorrow.  Active Problems:   Cocaine abuse (HCC)  Counseling patient on cessation daily as I am very concerned that the patient is placing himself at risk of cocaine induced cardiomyopathy if it has not developed already.    Nicotine dependence, cigarettes, uncomplicated  Patient is being counseled daily on smoking cessation.    Benign prostate hyperplasia  Continue home regimen of Proscar.   Code Status:  Full code Family Communication: deferred   Status is: Observation  The patient remains OBS appropriate and will d/c before 2 midnights.  Dispo: The patient is from: Home              Anticipated d/c is to: Home  Patient currently is not medically stable to d/c.   Difficult to place patient No        Marinda Elk MD [facility] Pager [phone]  If 7PM-7AM, please contact night-coverage [email or web address] Use universal [place] password for that web site. If you do not have the password, please call the hospital operator.  07/14/2020, 10:33 PM
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
## 2020-07-14 NOTE — Discharge Summary (Signed)
Physician Discharge Summary  Patient ID: Curtis Griffin MRN: [id number] DOB/AGE: 06-18-1955 65 y.o.  Admit date: 07/14/2020 Discharge date: 07/14/2020  Admission Diagnoses:  Discharge Diagnoses:  Principal Problem:   Syncope Active Problems:   Cocaine abuse (HCC)   Nicotine dependence, cigarettes, uncomplicated   Benign prostate hyperplasia   Discharged Condition: [facility]  Hospital Course:   65 year old male with past medical history of nicotine dependence, benign prostatic hyperplasia, depression and cocaine use who presents to [facility] emergency department brought in by EMS after experiencing an episode of loss of consciousness.  Due to clinical concern for underlying undiagnosed cocaine induced cardiomyopathy patient was to be placed in observation in the hospital service for overnight telemetry monitoring, cardiac enzymes and echocardiogram the morning.  Unfortunately, shortly after being admitted the patient left emergency department AGAINST MEDICAL ADVICE.  Patient was advised by nursing the patient placed himself at risk of morbidity and even death.  Patient voices understanding of the risks and left AGAINST MEDICAL ADVICE.  Patient was advised to seek medical attention as soon as possible.   Disposition:   Home AMA     Signed: Marinda Elk 07/14/2020, 11:33 PM

## 2020-07-14 NOTE — ED Triage Notes (Signed)
Pt arrives via [facility] after syncopal episode at [employer]. Per pt he has had approx 2 syncopal episodes a month, reports decrease in PO intake, and stress at home. Pt also c/o dry, nonproductive cough w/ shortness of breath. Pt in no distress in room.  160/90 73 HR 18 RR CBG 121 94% RA

## 2020-07-15 LAB — SARS CORONAVIRUS 2 (TAT 6-24 HRS): SARS Coronavirus 2: NEGATIVE

## 2020-07-15 NOTE — ED Notes (Addendum)
Pt upset in hallway, stating he is uncomfortable and doesn't want to be in a hallway bed. RN explained that we don't have rooms for all of the patients and the hallway is the best we can do, RN asked pt what I could do to make pt more comfortable. Pt stated he wanted to go home, it was too hot in the hallway, he didn't want to be here. RN explained that pt would then be leaving against medical advice, pt agreed and insistent on leaving. IV removed, pt signed AMA form. Shalhoub MD aware.

## 2020-10-12 DIAGNOSIS — Z1152 Encounter for screening for COVID-19: Secondary | ICD-10-CM | POA: Diagnosis not present

## 2020-10-29 ENCOUNTER — Emergency Department (HOSPITAL_COMMUNITY): Payer: No Typology Code available for payment source

## 2020-10-29 ENCOUNTER — Other Ambulatory Visit: Payer: Self-pay

## 2020-10-29 ENCOUNTER — Encounter (HOSPITAL_COMMUNITY): Payer: Self-pay | Admitting: Emergency Medicine

## 2020-10-29 ENCOUNTER — Emergency Department (HOSPITAL_COMMUNITY)
Admission: EM | Admit: 2020-10-29 | Discharge: 2020-10-29 | Disposition: A | Discharge status: Home / Self Care | Payer: No Typology Code available for payment source | Attending: Emergency Medicine | Admitting: Emergency Medicine

## 2020-10-29 DIAGNOSIS — R0602 Shortness of breath: Secondary | ICD-10-CM | POA: Diagnosis present

## 2020-10-29 DIAGNOSIS — R059 Cough, unspecified: Secondary | ICD-10-CM | POA: Insufficient documentation

## 2020-10-29 DIAGNOSIS — F17210 Nicotine dependence, cigarettes, uncomplicated: Secondary | ICD-10-CM | POA: Insufficient documentation

## 2020-10-29 LAB — BASIC METABOLIC PANEL
Anion gap: 10 (ref 5–15)
BUN: 17 mg/dL (ref 8–23)
CO2: 22 mmol/L (ref 22–32)
Calcium: 8.9 mg/dL (ref 8.9–10.3)
Chloride: 104 mmol/L (ref 98–111)
Creatinine, Ser: 1.31 mg/dL — ABNORMAL HIGH (ref 0.61–1.24)
GFR, Estimated: >60 mL/min (ref >60)
Glucose, Bld: 115 mg/dL — ABNORMAL HIGH (ref 70–99)
Potassium: 3.8 mmol/L (ref 3.5–5.1)
Sodium: 136 mmol/L (ref 135–145)

## 2020-10-29 LAB — CBC WITH DIFFERENTIAL/PLATELET
Abs Immature Granulocytes: 0.02 10*3/uL (ref 0.00–0.07)
Basophils Absolute: 0 10*3/uL (ref 0.0–0.1)
Basophils Relative: 1 %
Eosinophils Absolute: 0.1 10*3/uL (ref 0.0–0.5)
Eosinophils Relative: 1 %
HCT: 45.3 % (ref 39.0–52.0)
Hemoglobin: 14.5 g/dL (ref 13.0–17.0)
Immature Granulocytes: 0 %
Lymphocytes Relative: 30 %
Lymphs Abs: 1.9 10*3/uL (ref 0.7–4.0)
MCH: 28.5 pg (ref 26.0–34.0)
MCHC: 32 g/dL (ref 30.0–36.0)
MCV: 89.2 fL (ref 80.0–100.0)
Monocytes Absolute: 0.7 10*3/uL (ref 0.1–1.0)
Monocytes Relative: 12 %
Neutro Abs: 3.5 10*3/uL (ref 1.7–7.7)
Neutrophils Relative %: 56 %
Platelets: 305 10*3/uL (ref 150–400)
RBC: 5.08 MIL/uL (ref 4.22–5.81)
RDW: 13.8 % (ref 11.5–15.5)
WBC: 6.3 10*3/uL (ref 4.0–10.5)
nRBC: 0 % (ref 0.0–0.2)

## 2020-10-29 LAB — TROPONIN I (HIGH SENSITIVITY): Troponin I (High Sensitivity): 11 ng/L (ref <18)

## 2020-10-29 LAB — BRAIN NATRIURETIC PEPTIDE: B Natriuretic Peptide: 17.3 pg/mL (ref 0.0–100.0)

## 2020-10-29 MED ORDER — ALBUTEROL SULFATE HFA 108 (90 BASE) MCG/ACT IN AERS: 2.0000 | INHALATION_SPRAY | Freq: Four times a day (QID) | RESPIRATORY_TRACT | 2 refills | Status: AC | PRN

## 2020-10-29 NOTE — ED Notes (Signed)
DC instructions reviewed with pt. PT verbalized understanding. PT DC °

## 2020-10-29 NOTE — ED Triage Notes (Addendum)
Pt reports SOB and non-productive cough x 2 days.  Denies chest pain.  States he was scheduled for a stress test this week but forgot about appointment.  Pt's son also a pt.    Acuity 2 due to abnormal EKG.

## 2020-10-29 NOTE — ED Provider Notes (Signed)
3:16 PM Care assumed from Dr. Stevie Kern and Dr. Susann Givens.  At time of transfer care, patient is awaiting for results of delta troponin.  If second troponin is reassuring, plan of care be discharged home with outpatient cardiology follow-up which was already ordered by the previous team.  Anticipate discharge after repeat labs.  Per nursing report, the lab could not find the troponin lab that was sent and when patient was informed of this, he did not want to have a redraw.  He would rather go home.  Thus, per previous plan we will discharge the patient as he is feeling better.  Clinical Impression: 1. SOB (shortness of breath)     Disposition: Discharge  Condition: Good  I have discussed the results, Dx and Tx plan with the pt(& family if present). He/she/they expressed understanding and agree(s) with the plan. Discharge instructions discussed at great length. Strict return precautions discussed and pt &/or family have verbalized understanding of the instructions. No further questions at time of discharge.    New Prescriptions   ALBUTEROL (VENTOLIN HFA) 108 (90 BASE) MCG/ACT INHALER    Inhale 2 puffs into the lungs every 6 (six) hours as needed for wheezing or shortness of breath.    Follow Up: [facility], [facility] [address] [place] [address] [phone]  Call in 3 days If symptoms worsen     Shirla Hodgkiss, Canary Brim, MD 10/29/20 1622

## 2020-10-29 NOTE — ED Provider Notes (Signed)
Emergency Medicine Provider Triage Evaluation Note  Curtis Griffin , a 65 y.o. male  was evaluated in triage.  Pt complains of shortness of breath times roughly a week.  Denies any chest pain, has chronic bronchitis but has not used his inhaler for multiple weeks.  Denies any fevers..  Review of Systems  Positive: Shortness of breath, cough Negative: Chest pain, feve  Physical Exam  BP (!) 137/91 (BP Location: Left Arm)   Pulse 60   Temp 98 F (36.7 C) (Oral)   Resp 14   SpO2 96%  Gen:   Awake, no distress   Resp:  Normal effort  MSK:   Moves extremities without difficulty  Other:    Medical Decision Making  Medically screening exam initiated at 11:14 AM.  Appropriate orders placed.  Curtis Griffin was informed that the remainder of the evaluation will be completed by another provider, this initial triage assessment does not replace that evaluation, and the importance of remaining in the ED until their evaluation is complete.     Theron Arista, PA-C 10/29/20 1114    Benjiman Core, MD 10/29/20 [phone]

## 2020-10-29 NOTE — Discharge Instructions (Addendum)
Overall, your labs today look okay. It will be important to follow-up cardiologist for further cardiac evaluation - we are putting in a referral to a cardiologist. Regarding your shortness of breath, I am sending in a rescue inhaler (albuterol) - if you are having to use your albuterol persistently then please follow-up with your primary care physician.

## 2020-10-29 NOTE — ED Notes (Signed)
Lab states they never received 2nd trop sample.  PT wants to go.  Dr. Rush Landmark informed.  Stated he will DC.

## 2020-10-29 NOTE — ED Provider Notes (Signed)
[facility] EMERGENCY DEPARTMENT Provider Note   CSN: [id number] Arrival date & time: 10/29/20  1051     History Chief Complaint  Patient presents with   Shortness of Breath    Curtis Griffin is a 65 y.o. male presenting with shortness of breath. He states that he has been dealing with this for a while. It is worse in the morning when he is coughing. He has chronic bronchitis and has not had any albuterol inhalers for several months now. He reports that his shortness of breath from this morning is now resolved and he is feeling fine. Patient had difficulty with further characterization of the timing and length of his shortness of breath.  PMH significant for nicotine dependence, BPH, depression, and cocaine use.     Past Medical History:  Diagnosis Date   Benign prostate hyperplasia 07/14/2020   Cocaine abuse (HCC) 07/14/2020   Nicotine dependence, cigarettes, uncomplicated 07/14/2020    Patient Active Problem List   Diagnosis Date Noted   Syncope 07/14/2020   Cocaine abuse (HCC) 07/14/2020   Nicotine dependence, cigarettes, uncomplicated 07/14/2020   Benign prostate hyperplasia 07/14/2020    History reviewed. No pertinent surgical history.     Family History  Problem Relation Age of Onset   Healthy Mother    Healthy Father     Social History   Tobacco Use   Smoking status: Every Day    Packs/day: 0.25    Types: Cigarettes   Smokeless tobacco: Never  Substance Use Topics   Alcohol use: Not Currently   Drug use: Not Currently    Home Medications Prior to Admission medications   Medication Sig Start Date End Date Taking? Authorizing Provider  albuterol (VENTOLIN HFA) 108 (90 Base) MCG/ACT inhaler Inhale 2 puffs into the lungs every 6 (six) hours as needed for wheezing or shortness of breath. 10/29/20  Yes Sheanna Dail, DO  carvedilol (COREG) 6.25 MG tablet Take 0.5 tablets by mouth 2 (two) times daily. 10/07/20  Yes [provider]   finasteride (PROSCAR) 5 MG tablet Take 5 mg by mouth daily. 11/09/19  Yes [provider]  losartan (COZAAR) 50 MG tablet Take 25 mg by mouth daily. 09/15/20  Yes [provider]  Elastic Bandages & Supports (MEDICAL COMPRESSION STOCKINGS) MISC Wear daily and as needed for swelling of lower legs Patient not taking: No sig reported 08/19/19   Linus Mako B, NP  sildenafil (VIAGRA) 100 MG tablet Take 100 mg by mouth as needed. 09/15/20   [provider]    Allergies    Patient has no known allergies.  Review of Systems   Review of Systems  Constitutional:  Negative for activity change, appetite change, chills, diaphoresis, fatigue and fever.  HENT:  Negative for congestion, rhinorrhea and trouble swallowing.   Respiratory:  Positive for cough and shortness of breath. Negative for wheezing.   Cardiovascular:  Negative for chest pain, palpitations and leg swelling.  Gastrointestinal:  Negative for abdominal distention, abdominal pain, constipation, diarrhea, nausea and vomiting.  Genitourinary:  Negative for decreased urine volume and difficulty urinating.  Musculoskeletal:  Negative for arthralgias.       Neck soreness  Neurological:  Negative for dizziness, weakness and headaches.   Physical Exam Updated Vital Signs BP (!) 144/77 (BP Location: Right Arm)   Pulse 60   Temp 98 F (36.7 C) (Oral)   Resp 16   SpO2 97%   Physical Exam Constitutional:      Appearance:  He is well-developed. He is obese.  HENT:     Head: Normocephalic and atraumatic.  Eyes:     Extraocular Movements: Extraocular movements intact.     Pupils: Pupils are equal, round, and reactive to light.  Cardiovascular:     Rate and Rhythm: Normal rate and regular rhythm.  Pulmonary:     Effort: Pulmonary effort is normal.     Breath sounds: Normal breath sounds. No decreased breath sounds, wheezing, rhonchi or rales.  Chest:     Chest wall: No mass or tenderness.  Abdominal:      General: Bowel sounds are normal.     Palpations: Abdomen is soft.  Musculoskeletal:        General: Normal range of motion.     Cervical back: Normal range of motion and neck supple.     Right lower leg: No edema.     Left lower leg: No edema.  Skin:    General: Skin is warm and dry.     Capillary Refill: Capillary refill takes less than 2 seconds.  Neurological:     General: No focal deficit present.     Mental Status: He is alert.    ED Results / Procedures / Treatments   Labs (all labs ordered are listed, but only abnormal results are displayed) Labs Reviewed  BASIC METABOLIC PANEL - Abnormal; Notable for the following components:      Result Value   Glucose, Bld 115 (*)    Creatinine, Ser 1.31 (*)    All other components within normal limits  RESP PANEL BY RT-PCR (FLU A&B, COVID) ARPGX2  CBC WITH DIFFERENTIAL/PLATELET  BRAIN NATRIURETIC PEPTIDE  TROPONIN I (HIGH SENSITIVITY)  TROPONIN I (HIGH SENSITIVITY)    EKG EKG Interpretation  Date/Time:  Saturday October 29 2020 10:55:24 EDT Ventricular Rate:  60 PR Interval:  186 QRS Duration: 88 QT Interval:  410 QTC Calculation: 410 R Axis:   -17 Text Interpretation: Sinus rhythm with Premature atrial complexes Minimal voltage criteria for LVH, may be normal variant ( R in aVL ) ST & Marked T wave abnormality, consider anterolateral ischemia Abnormal ECG no significant change when compared to 07/14/2020 Confirmed by Marianna Fuss ([id number]) on 10/29/2020 11:09:24 AM  Radiology DG Chest 2 View  Result Date: 10/29/2020 CLINICAL DATA:  Shortness of breath EXAM: CHEST - 2 VIEW COMPARISON:  07/14/2020 FINDINGS: Cardiomegaly status post median sternotomy. Both lungs are clear. Disc degenerative disease of the thoracic spine. IMPRESSION: Cardiomegaly without acute abnormality of the lungs. Electronically Signed   By: Jearld Lesch M.D.   On: 10/29/2020 11:53    Procedures Procedures   Medications Ordered in ED Medications -  No data to display  ED Course  I have reviewed the triage vital signs and the nursing notes.  Pertinent labs & imaging results that were available during my care of the patient were reviewed by me and considered in my medical decision making (see chart for details).    MDM Rules/Calculators/A&P  KABIR BRANNOCK is a 65 y.o. male presenting with shortness of breath x1 week. PMH significant for nicotine dependence, BPH, depression, and cocaine use.  Patient initially presented with shortness of breath in the setting of recent coughing spells and not having albuterol rescue inhaler. Patient's symptoms resolved prior to being roomed in the ED and he is back at baseline.  Labs reassuring for normal troponin of 11, BNP 17, CBC 6.3. Elevated creatinine, but not currently an AKI.   EKG is concerning  but unchanged from prior with Sinus rhythm with PACs, ST and T wave abnormality concerning for anterolateral ischemia. Patient needs to follow-up with cardiology for stress-test outpatient.   Patient will be stable for discharge once second troponin is trended and remains negative. Discussed with Dr. Rush Landmark.  Final Clinical Impression(s) / ED Diagnoses Final diagnoses:  SOB (shortness of breath)    Rx / DC Orders ED Discharge Orders          Ordered    Ambulatory referral to Cardiology       Comments: Patient with significantly abnormal EKG and CHF, has not yet followed up with cardiology. Patient in need of stress test and further management.   10/29/20 1458    albuterol (VENTOLIN HFA) 108 (90 Base) MCG/ACT inhaler  Every 6 hours PRN        10/29/20 1458             Evelena Leyden, DO 10/29/20 1508    Milagros Loll, MD 10/31/20 [phone]

## 2020-12-02 ENCOUNTER — Other Ambulatory Visit: Payer: Self-pay

## 2020-12-02 ENCOUNTER — Emergency Department (HOSPITAL_COMMUNITY)
Admission: EM | Admit: 2020-12-02 | Discharge: 2020-12-02 | Disposition: A | Discharge status: Home / Self Care | Payer: No Typology Code available for payment source | Attending: Emergency Medicine | Admitting: Emergency Medicine

## 2020-12-02 ENCOUNTER — Emergency Department (HOSPITAL_COMMUNITY): Payer: No Typology Code available for payment source

## 2020-12-02 ENCOUNTER — Encounter (HOSPITAL_COMMUNITY): Payer: Self-pay | Admitting: Emergency Medicine

## 2020-12-02 DIAGNOSIS — M79671 Pain in right foot: Secondary | ICD-10-CM | POA: Insufficient documentation

## 2020-12-02 DIAGNOSIS — F17210 Nicotine dependence, cigarettes, uncomplicated: Secondary | ICD-10-CM | POA: Insufficient documentation

## 2020-12-02 NOTE — ED Notes (Signed)
Back in lobby 

## 2020-12-02 NOTE — Discharge Instructions (Signed)
As discussed, today's evaluation has been reassuring.  There is no x-ray evidence of a fracture.  There is likely inflammation and bruising in your foot contributing to your pain.  Please use ibuprofen, 400 mg, 3 times daily with food and Tylenol, 650 mg, up to 3 times daily as needed for additional pain control.  [facility] if your symptoms do not improve after 3 or 4 days.

## 2020-12-02 NOTE — ED Notes (Signed)
back

## 2020-12-02 NOTE — ED Notes (Signed)
Large post op shoe applied to the Lt foot
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
## 2020-12-02 NOTE — ED Notes (Signed)
outside

## 2020-12-02 NOTE — ED Triage Notes (Signed)
Patient coming from home, complaint of right foot pain for 2 weeks. States hurt in the arch of his foot.

## 2021-12-10 ENCOUNTER — Ambulatory Visit (HOSPITAL_COMMUNITY)
Admission: EM | Admit: 2021-12-10 | Discharge: 2021-12-10 | Disposition: A | Discharge status: Home / Self Care | Payer: No Typology Code available for payment source | Attending: Physician Assistant | Admitting: Physician Assistant

## 2021-12-10 ENCOUNTER — Encounter (HOSPITAL_COMMUNITY): Payer: Self-pay | Admitting: Emergency Medicine

## 2021-12-10 DIAGNOSIS — I10 Essential (primary) hypertension: Secondary | ICD-10-CM | POA: Diagnosis not present

## 2021-12-10 DIAGNOSIS — Z760 Encounter for issue of repeat prescription: Secondary | ICD-10-CM | POA: Diagnosis not present

## 2021-12-10 HISTORY — DX: Essential (primary) hypertension: I10

## 2021-12-10 MED ORDER — CARVEDILOL 6.25 MG PO TABS: 3.1250 mg | ORAL_TABLET | Freq: Two times a day (BID) | ORAL | 2 refills | Status: DC

## 2021-12-10 MED ORDER — LOSARTAN POTASSIUM 50 MG PO TABS: 25.0000 mg | ORAL_TABLET | Freq: Every day | ORAL | 2 refills | Status: AC

## 2021-12-10 MED ORDER — FINASTERIDE 5 MG PO TABS: 5.0000 mg | ORAL_TABLET | Freq: Every day | ORAL | 0 refills | Status: AC

## 2021-12-10 NOTE — ED Triage Notes (Signed)
Pt reports that had BP pills for a year but hasn't been taking it. Reports that his BP been high and started taking medications past couple weeks.

## 2021-12-10 NOTE — ED Provider Notes (Signed)
[facility]    CSN: [id number] Arrival date & time: 12/10/21  1421      History   Chief Complaint Chief Complaint  Patient presents with   Hypertension    HPI Curtis Griffin is a 66 y.o. male.   Pt reports he has been having elevated BP readings at home.  He reports he was on blood pressure medications for a long time, but had discontinued them.  He restarted his BP medication. He reports he does need refills on his medications today.  His PCP is with the [place], he has no upcoming appointments.  He denies chest pain, shortness of breath, palpitations, lower extremity edema, headache, blurred vision.     Past Medical History:  Diagnosis Date   Benign prostate hyperplasia 07/14/2020   Cocaine abuse (HCC) 07/14/2020   Hypertension    Nicotine dependence, cigarettes, uncomplicated 07/14/2020    Patient Active Problem List   Diagnosis Date Noted   Syncope 07/14/2020   Cocaine abuse (HCC) 07/14/2020   Nicotine dependence, cigarettes, uncomplicated 07/14/2020   Benign prostate hyperplasia 07/14/2020    History reviewed. No pertinent surgical history.     Home Medications    Prior to Admission medications   Medication Sig Start Date End Date Taking? Authorizing Provider  albuterol (VENTOLIN HFA) 108 (90 Base) MCG/ACT inhaler Inhale 2 puffs into the lungs every 6 (six) hours as needed for wheezing or shortness of breath. 10/29/20   Lilland, Alana, DO  carvedilol (COREG) 6.25 MG tablet Take 0.5 tablets (3.125 mg total) by mouth 2 (two) times daily. 12/10/21 01/09/22  Ward, Tylene Fantasia, PA-C  Elastic Bandages & Supports (MEDICAL COMPRESSION STOCKINGS) MISC Wear daily and as needed for swelling of lower legs Patient not taking: No sig reported 08/19/19   Georgetta Haber, NP  finasteride (PROSCAR) 5 MG tablet Take 1 tablet (5 mg total) by mouth daily. 12/10/21   Ward, Tylene Fantasia, PA-C  losartan (COZAAR) 50 MG tablet Take 0.5-1 tablets (25-50 mg total) by mouth daily.  12/10/21 01/09/22  Ward, Tylene Fantasia, PA-C  sildenafil (VIAGRA) 100 MG tablet Take 100 mg by mouth daily as needed for erectile dysfunction. 09/15/20   [provider]    Family History Family History  Problem Relation Age of Onset   Healthy Mother    Healthy Father     Social History Social History   Tobacco Use   Smoking status: Every Day    Packs/day: 0.25    Types: Cigarettes   Smokeless tobacco: Never  Substance Use Topics   Alcohol use: Not Currently   Drug use: Not Currently     Allergies   Patient has no known allergies.   Review of Systems Review of Systems  Constitutional:  Negative for chills and fever.  HENT:  Negative for ear pain and sore throat.   Eyes:  Negative for pain and visual disturbance.  Respiratory:  Negative for cough and shortness of breath.   Cardiovascular:  Negative for chest pain and palpitations.  Gastrointestinal:  Negative for abdominal pain and vomiting.  Genitourinary:  Negative for dysuria and hematuria.  Musculoskeletal:  Negative for arthralgias and back pain.  Skin:  Negative for color change and rash.  Neurological:  Negative for seizures and syncope.  All other systems reviewed and are negative.    Physical Exam Triage Vital Signs ED Triage Vitals [12/10/21 1537]  Enc Vitals Group     BP      Pulse  Resp      Temp      Temp src      SpO2      Weight      Height      Head Circumference      Peak Flow      Pain Score 0     Pain Loc      Pain Edu?      Excl. in GC?    No data found.  Updated Vital Signs BP 133/83 (BP Location: Left Arm)   Pulse 65   Temp (!) 97.4 F (36.3 C) (Oral)   Resp 17   SpO2 97%   Visual Acuity Right Eye Distance:   Left Eye Distance:   Bilateral Distance:    Right Eye Near:   Left Eye Near:    Bilateral Near:     Physical Exam Vitals and nursing note reviewed.  Constitutional:      General: He is not in acute distress.    Appearance: He is well-developed.   HENT:     Head: Normocephalic and atraumatic.  Eyes:     Conjunctiva/sclera: Conjunctivae normal.  Cardiovascular:     Rate and Rhythm: Normal rate and regular rhythm.     Heart sounds: No murmur heard. Pulmonary:     Effort: Pulmonary effort is normal. No respiratory distress.     Breath sounds: Normal breath sounds.  Abdominal:     Palpations: Abdomen is soft.     Tenderness: There is no abdominal tenderness.  Musculoskeletal:        General: No swelling.     Cervical back: Neck supple.  Skin:    General: Skin is warm and dry.     Capillary Refill: Capillary refill takes less than 2 seconds.  Neurological:     Mental Status: He is alert.  Psychiatric:        Mood and Affect: Mood normal.      UC Treatments / Results  Labs (all labs ordered are listed, but only abnormal results are displayed) Labs Reviewed - No data to display  EKG   Radiology No results found.  Procedures Procedures (including critical care time)  Medications Ordered in UC Medications - No data to display  Initial Impression / Assessment and Plan / UC Course  I have reviewed the triage vital signs and the nursing notes.  Pertinent labs & imaging results that were available during my care of the patient were reviewed by me and considered in my medical decision making (see chart for details).     HTN.  Will refill medications today.  Pt is asymptomatic today and BP normal.  Advised to continue medications and schedule a follow up with PCP.  He will continue to monitor pressures at home.  Return precautions dscussed.  Final Clinical Impressions(s) / UC Diagnoses   Final diagnoses:  Primary hypertension  Medication refill     Discharge Instructions      Continue your hypertension medications, I have refilled Coreg and Losartan.  Make an appointment with your primary care physician for follow up and further refills Continue to monitor your blood pressure at home.      ED  Prescriptions     Medication Sig Dispense Auth. Provider   carvedilol (COREG) 6.25 MG tablet Take 0.5 tablets (3.125 mg total) by mouth 2 (two) times daily. 30 tablet Ward, Shanda Bumps Z, PA-C   finasteride (PROSCAR) 5 MG tablet Take 1 tablet (5 mg total) by mouth daily. 30  tablet Ward, Tylene Fantasia, PA-C   losartan (COZAAR) 50 MG tablet Take 0.5-1 tablets (25-50 mg total) by mouth daily. 30 tablet Ward, Tylene Fantasia, PA-C      PDMP not reviewed this encounter.   Ward, Tylene Fantasia, PA-C 12/10/21 [phone]

## 2021-12-10 NOTE — Discharge Instructions (Addendum)
Continue your hypertension medications, I have refilled Coreg and Losartan.  Make an appointment with your primary care physician for follow up and further refills Continue to monitor your blood pressure at home.

## 2022-05-31 NOTE — Progress Notes (Signed)
SDOH questions asked verbally. No SDOH needs at this time. Pt has PCP but can not remember the name. Pt says he has not been seen in a long time. Pt given lifestyle handout and community care clinic list. Pt recommended to check BP at home and keep a log to bring in to PCP.

## 2022-06-18 ENCOUNTER — Encounter: Payer: Self-pay | Admitting: *Deleted

## 2022-06-18 NOTE — Progress Notes (Signed)
Pt seen at 05/31/22 screening event where b/p was 158/98, and pt confirmed he has PCP at the [facility] but did not remember PCP name. At event pt did not identify any SDOH insecurities. Chart review of visible [facility] records reveals multiple appt over the past 12 months with the [place] in [place] and [place] with ongoing [facility] care management appt in Jan. and March, 2024. Chart review of [facility] notes reveals that pt has future [facility] appt on 06/26/22 (no specified specialty), on 07/24/22 with psych and on 09/25/22 with the medical clinic. Event RN documented giving pt lifestyle handout, recommending he monitor his b/p at home and give log to PCP, and offered community PCP if needed for local care. Calls to pt x 3 to verify access to [place] PCP to f/u elevated event b/p, but unable to contact pt or leave vm. Letter sent to pt to f/u elevated b/p with PCP.

## 2022-08-15 ENCOUNTER — Encounter: Payer: Self-pay | Admitting: *Deleted

## 2022-08-15 NOTE — Progress Notes (Signed)
Pt attended 05/31/22 screening event where his b/p was 158/98. During the initial event f/u, health equity team member was unable to contact pt by phone and chart review indicated he continued to be f/u by the [place] for primary, mental health , and care management support. Per chart review during this second event f/u, pt had 06/26/22 OV at the [facility], with provide [employer] PA-C, which included labwork with results. Pt has has a future appt with his [facility] PCP on Sept 10 and 13, 2024  as well as mental health provider on 11/21/22. No additional health equity team support indicated at this time.

## 2022-12-06 IMAGING — DX DG CHEST 2V
2 series · 2 of 2 positions shown · non-contrast
Comparison: 07/14/2020

CLINICAL DATA: Shortness of breath

EXAM:
CHEST - 2 VIEW

[w chest pa]
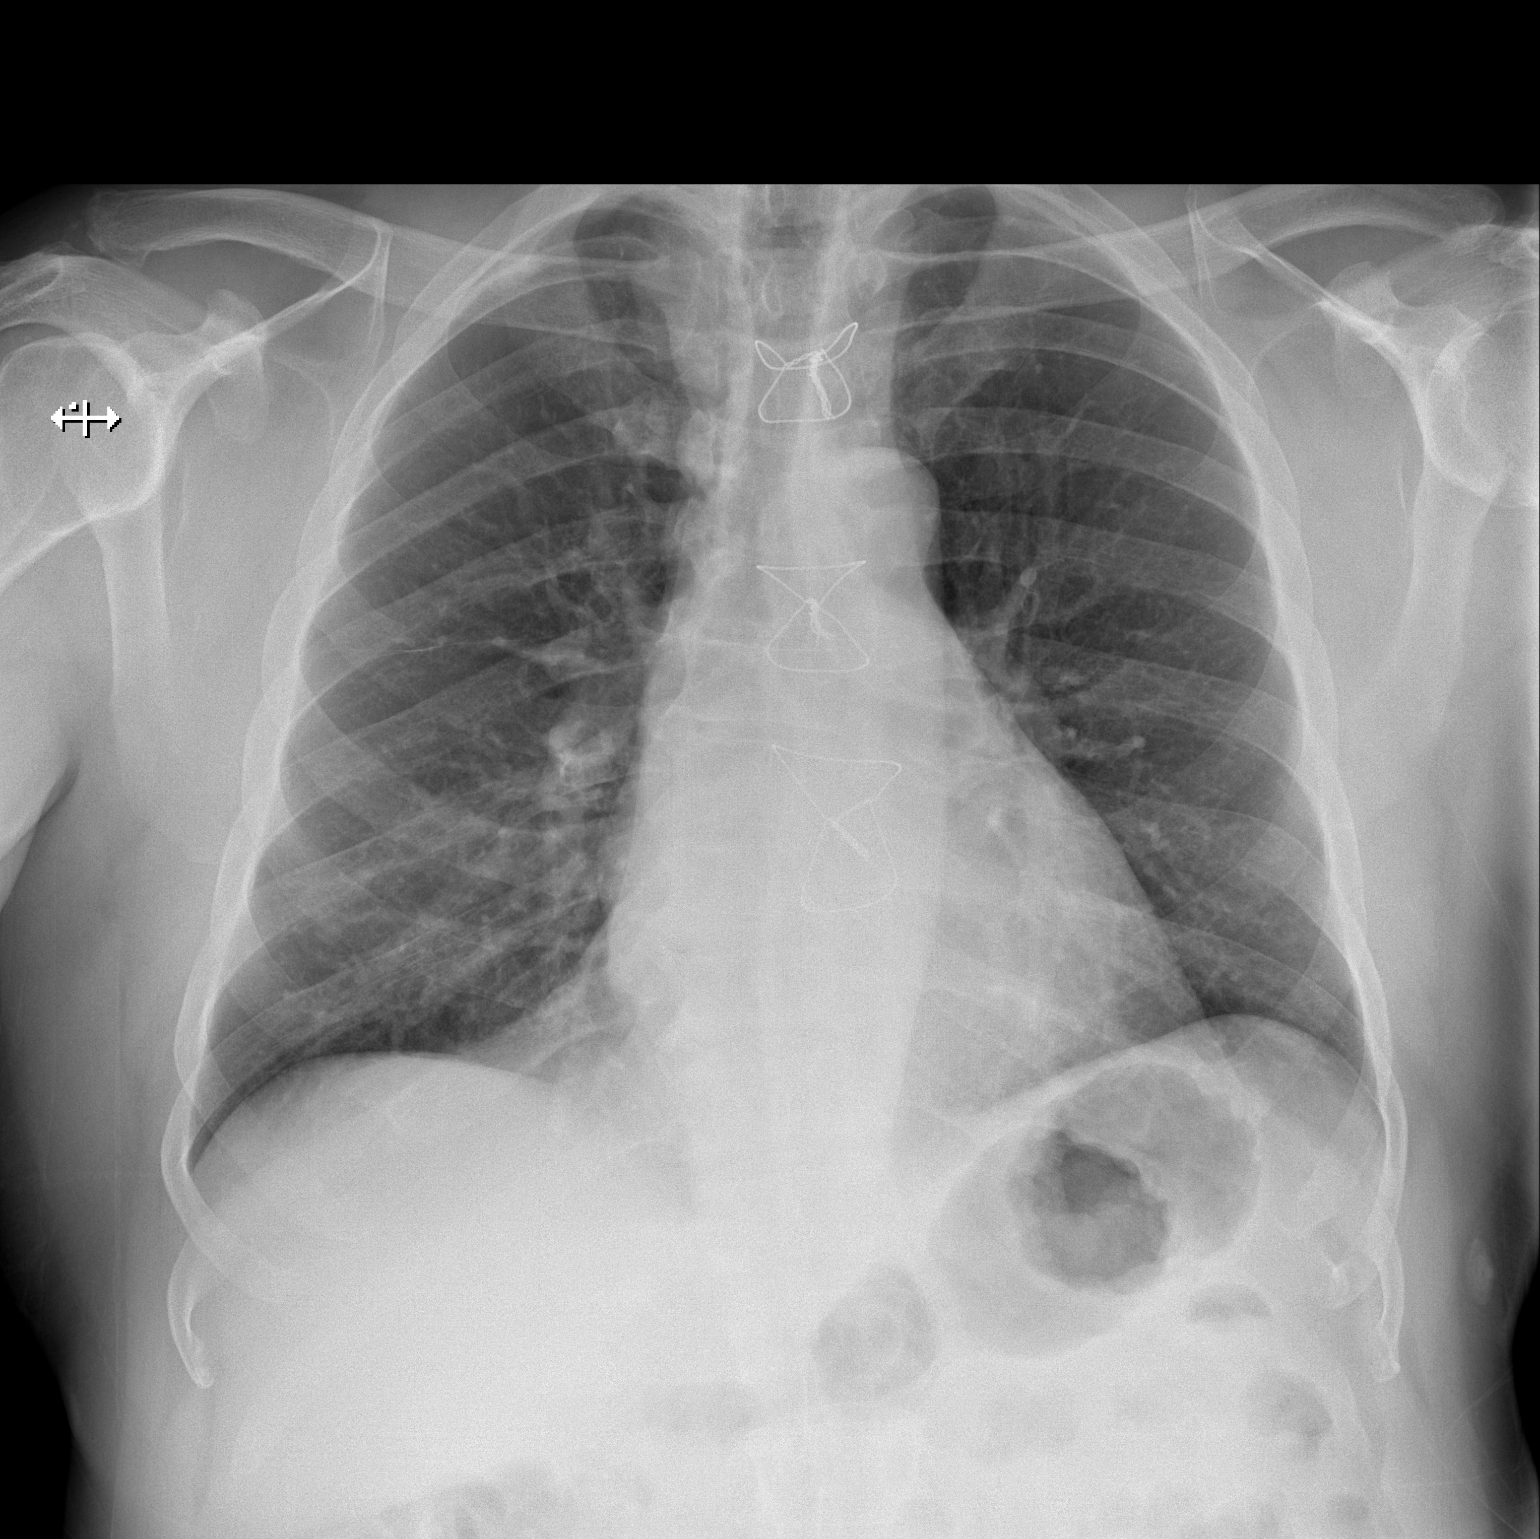

[w chest lat]
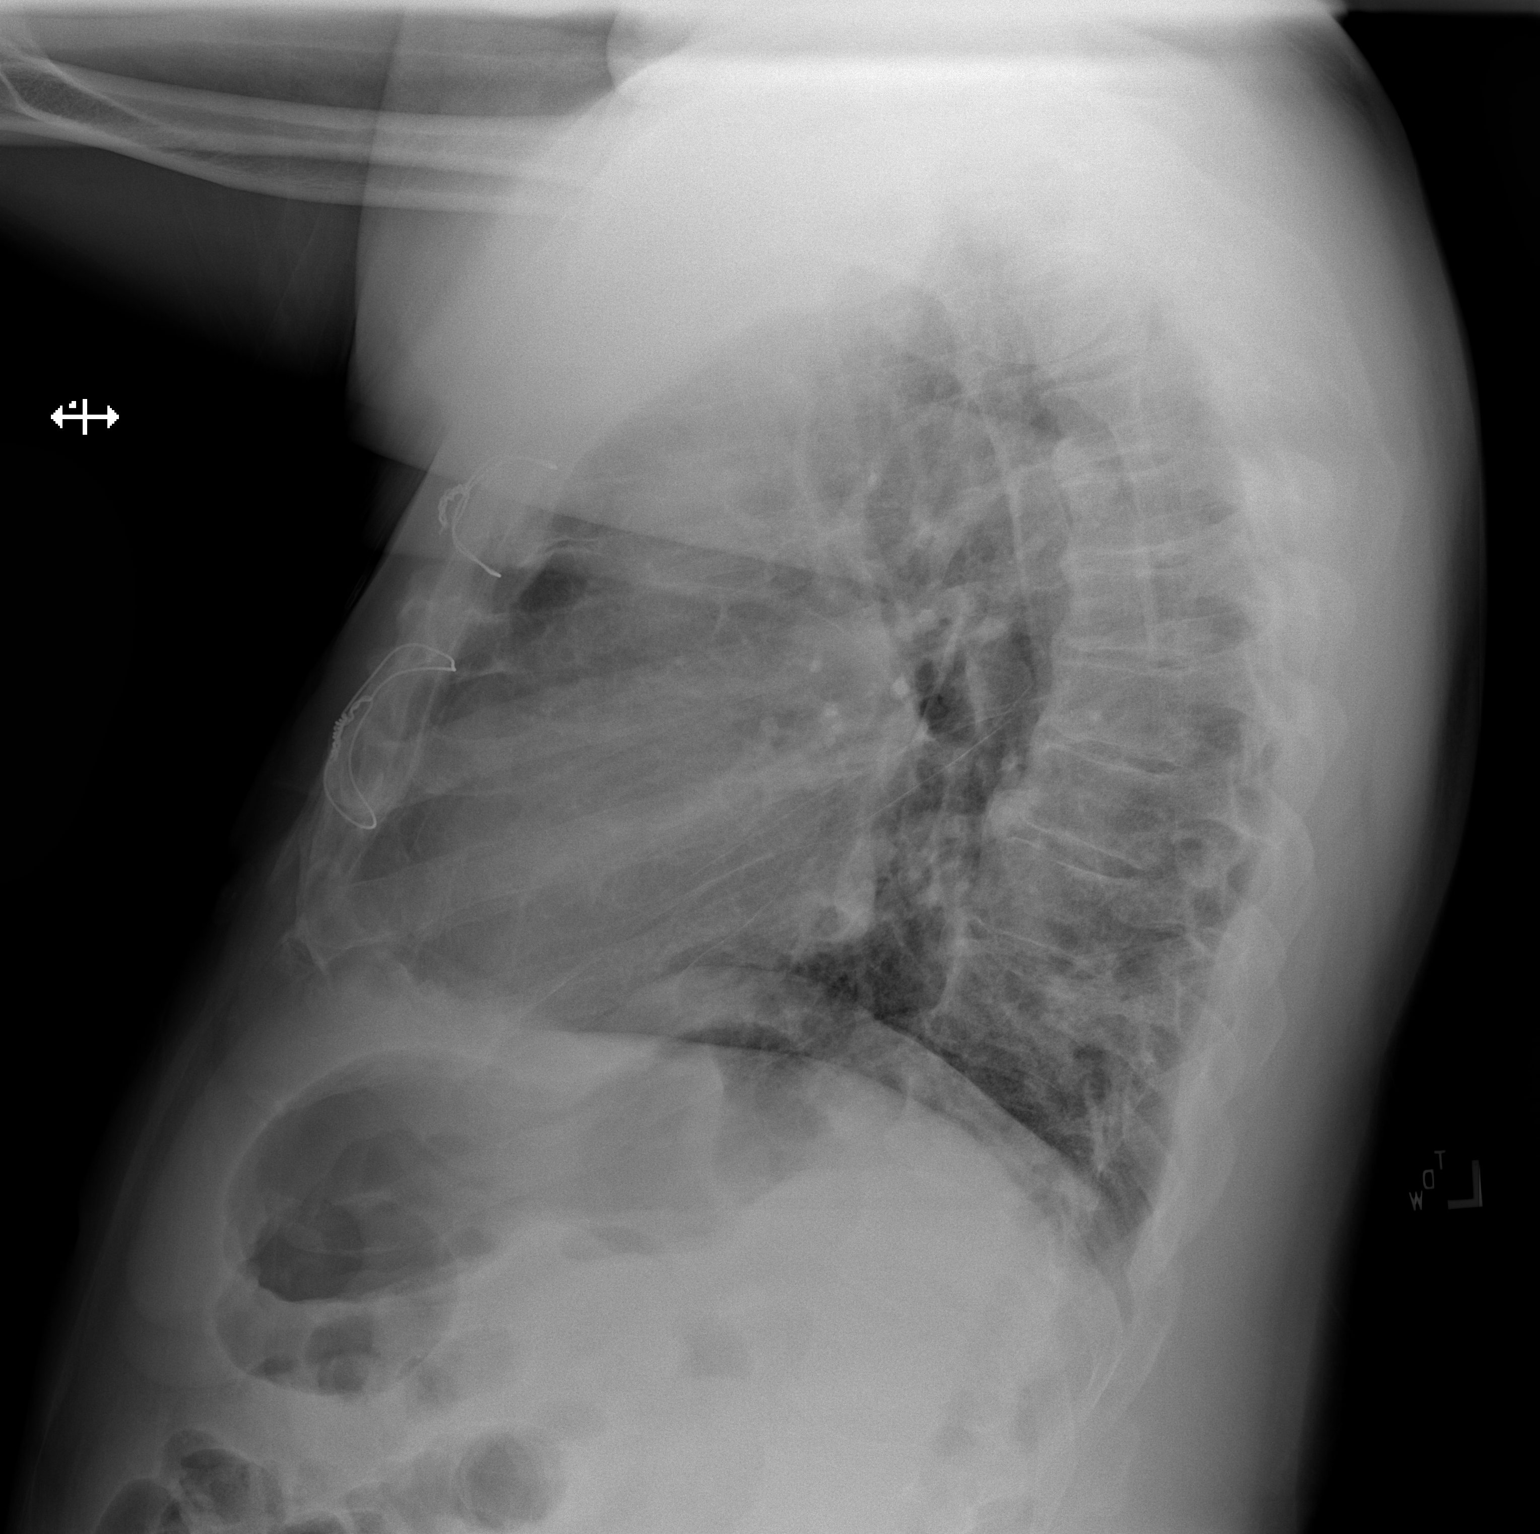

[2 of 2 positions shown; findings below may reference images not displayed]

FINDINGS: Cardiomegaly status post median sternotomy. Both lungs are clear.
Disc degenerative disease of the thoracic spine.
IMPRESSION: Cardiomegaly without acute abnormality of the lungs.

## 2023-01-09 IMAGING — CR DG FOOT COMPLETE 3+V*L*
3 series · 3 of 3 positions shown · non-contrast
Comparison: None.

CLINICAL DATA: Right foot pain.

EXAM:
LEFT FOOT - COMPLETE 3+ VIEW

[foot ap]
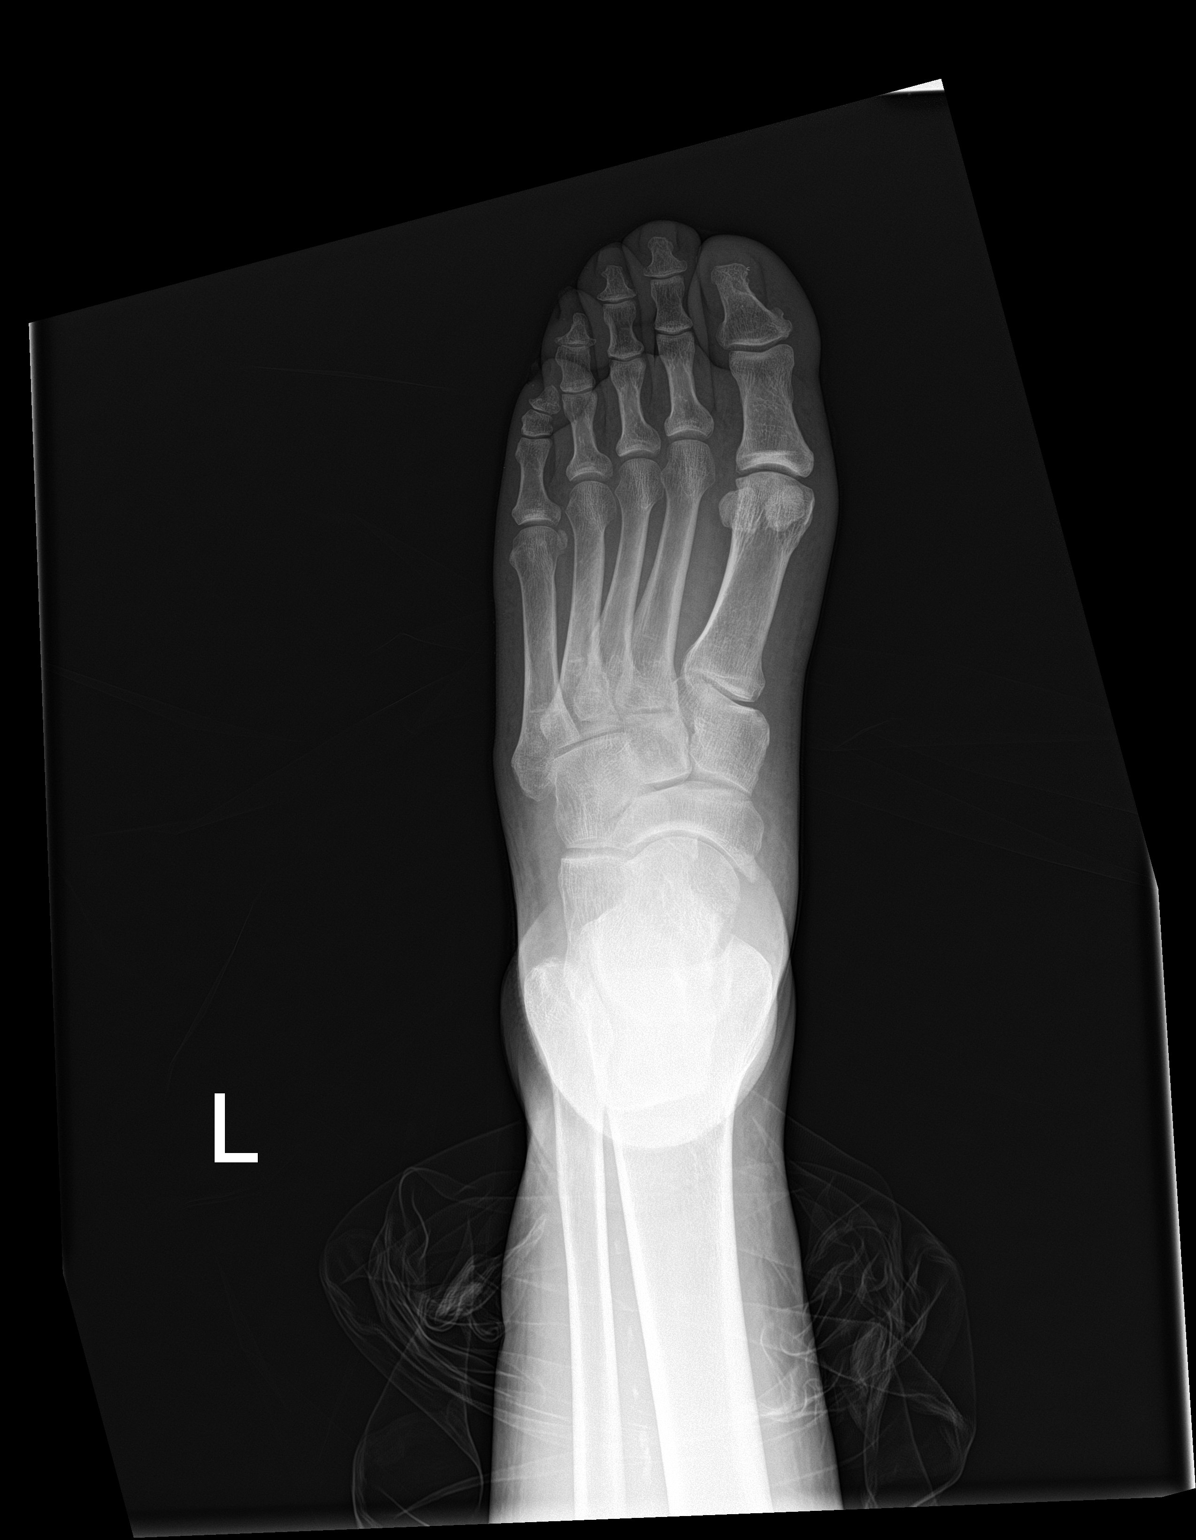

[foot obl]
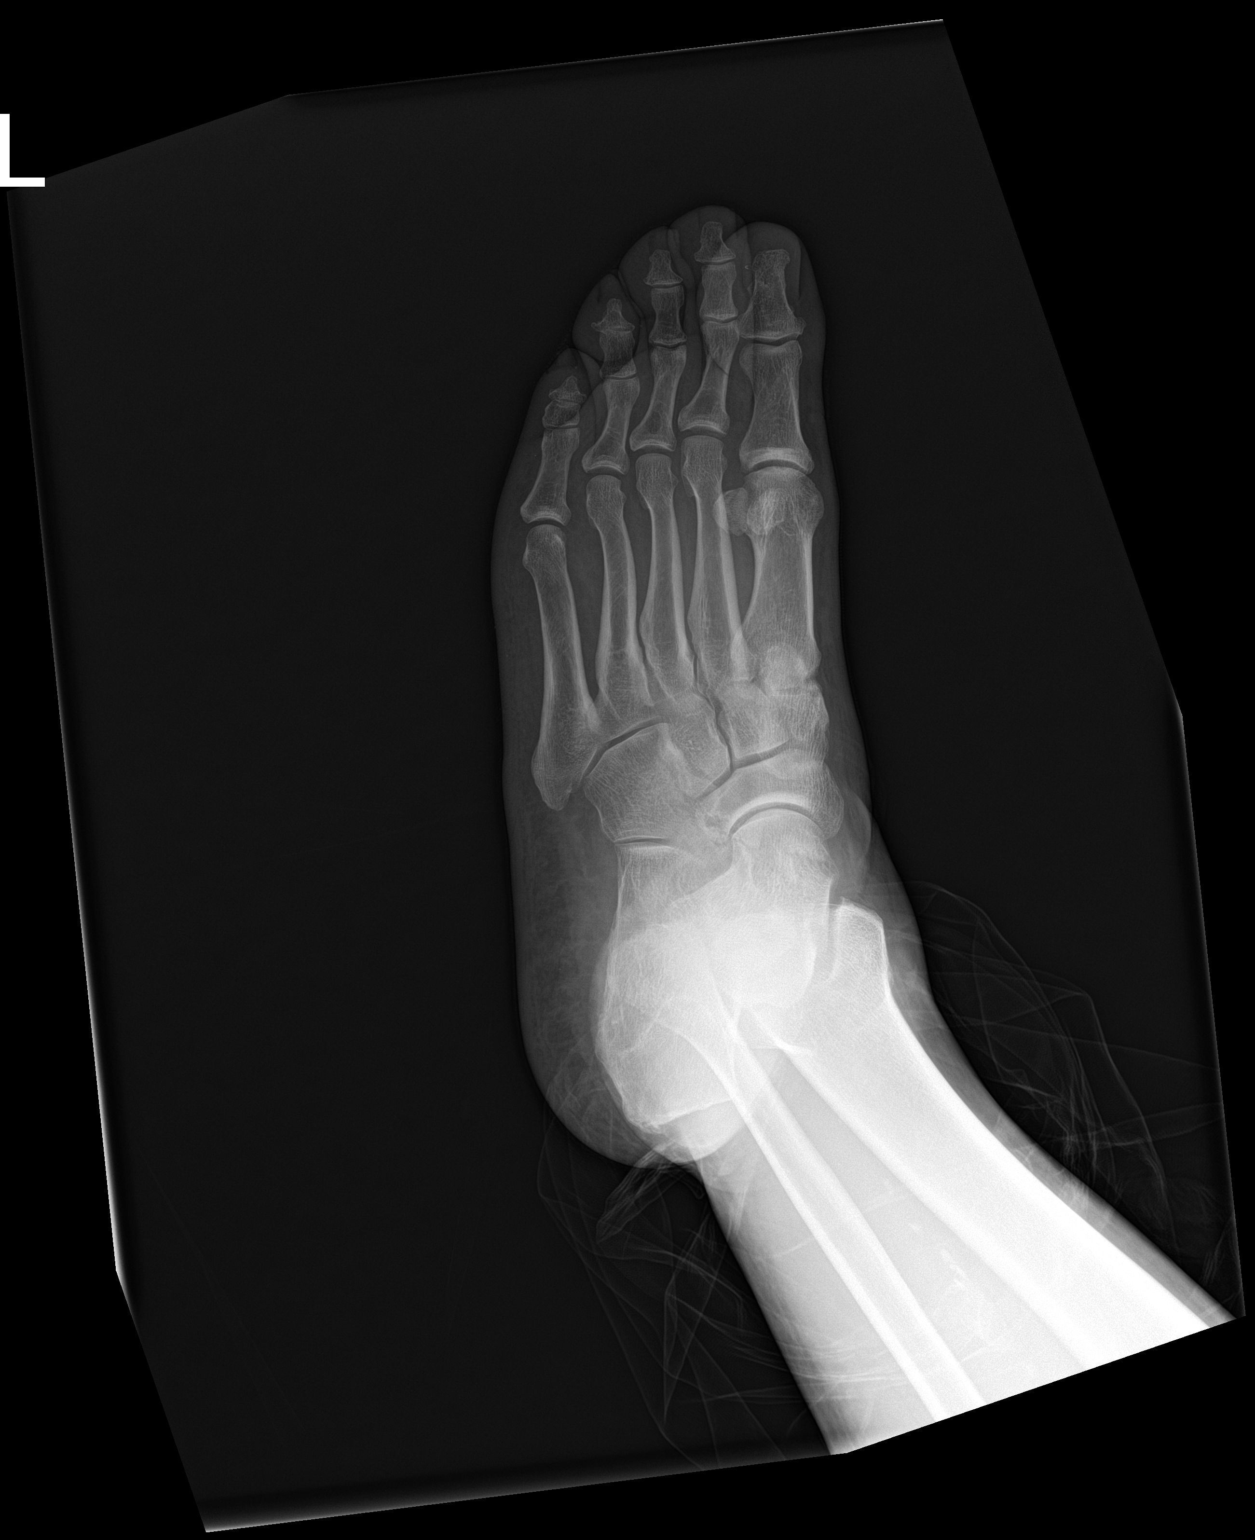

[foot lat]
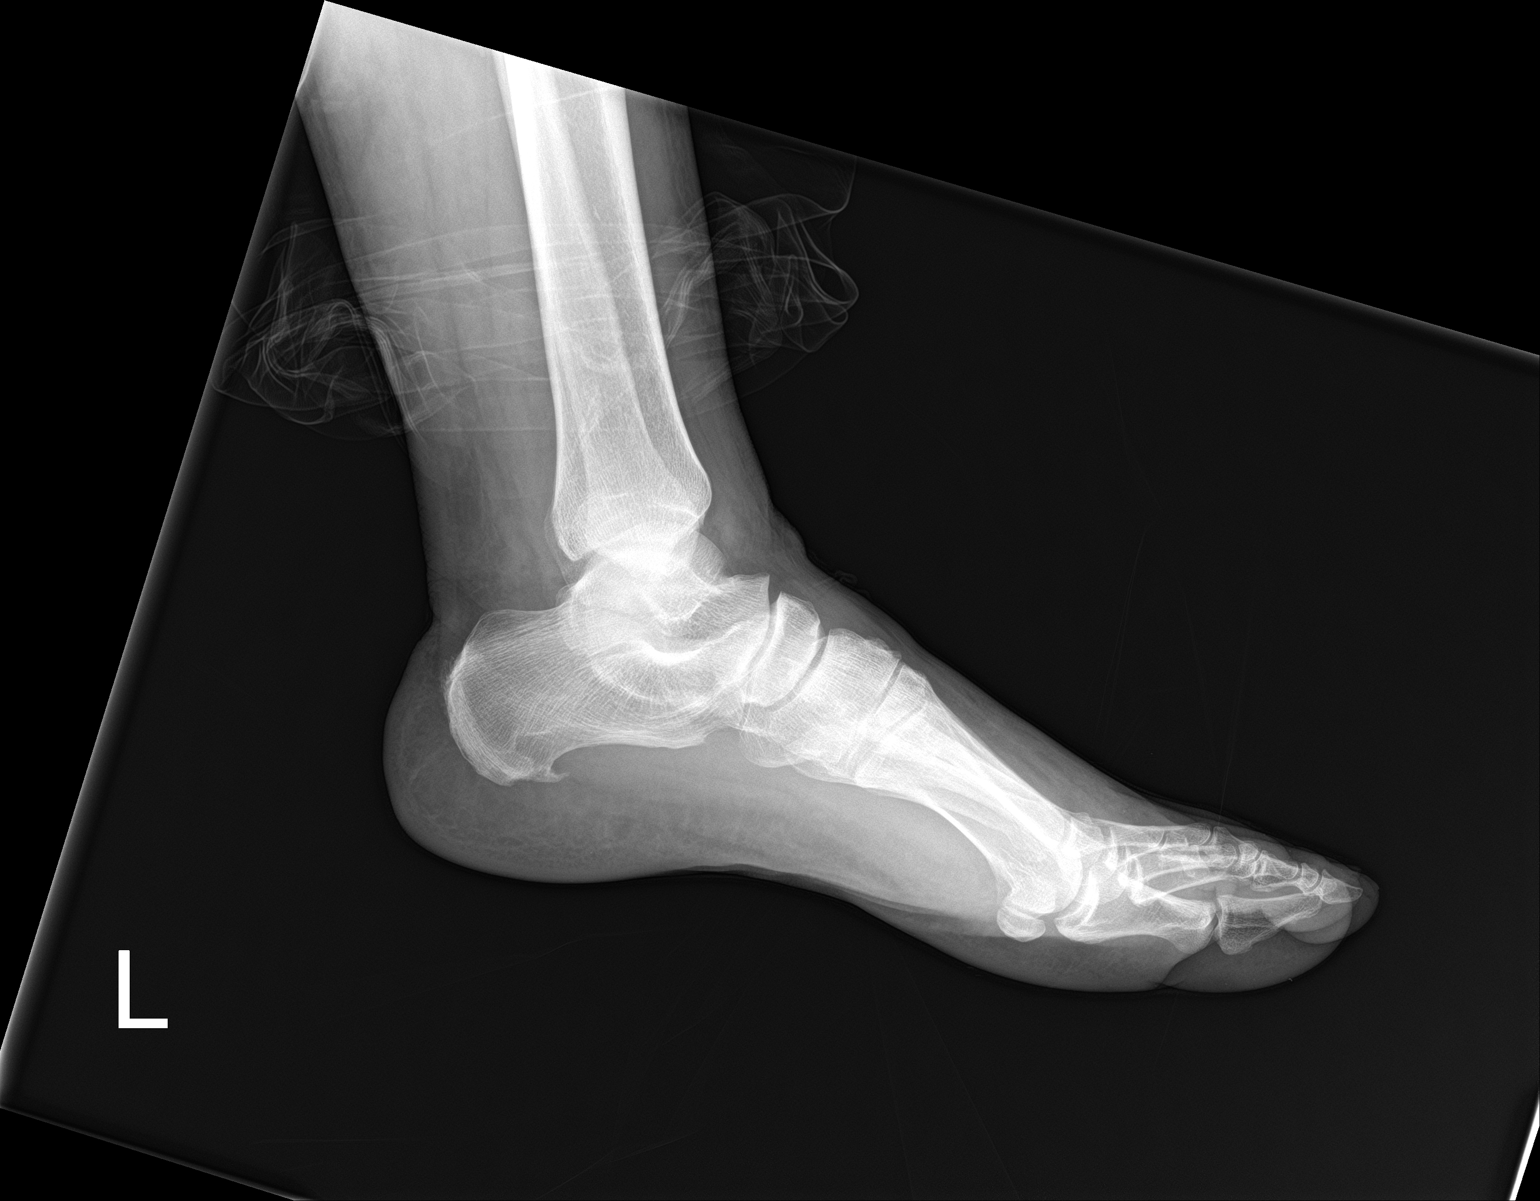

[3 of 3 positions shown; findings below may reference images not displayed]

FINDINGS: There is no evidence of fracture or dislocation. A small,
benign-appearing bone cyst is seen within the proximal phalanx of
the third left toe. Mild degenerative changes seen along the dorsal
aspect of the mid left foot. A moderate sized plantar calcaneal spur
is seen. Soft tissues are unremarkable.
IMPRESSION: Mild degenerative changes, as described above, with a moderate sized
plantar calcaneal spur.

## 2023-02-16 ENCOUNTER — Other Ambulatory Visit: Payer: Self-pay

## 2023-02-16 ENCOUNTER — Emergency Department (HOSPITAL_COMMUNITY): Payer: No Typology Code available for payment source

## 2023-02-16 ENCOUNTER — Emergency Department (HOSPITAL_COMMUNITY)
Admission: EM | Admit: 2023-02-16 | Discharge: 2023-02-17 | Discharge status: Against Medical Advice | Payer: No Typology Code available for payment source | Attending: Emergency Medicine | Admitting: Emergency Medicine

## 2023-02-16 ENCOUNTER — Encounter (HOSPITAL_COMMUNITY): Payer: Self-pay | Admitting: Emergency Medicine

## 2023-02-16 DIAGNOSIS — Z20822 Contact with and (suspected) exposure to covid-19: Secondary | ICD-10-CM | POA: Insufficient documentation

## 2023-02-16 DIAGNOSIS — Z5321 Procedure and treatment not carried out due to patient leaving prior to being seen by health care provider: Secondary | ICD-10-CM | POA: Insufficient documentation

## 2023-02-16 DIAGNOSIS — J101 Influenza due to other identified influenza virus with other respiratory manifestations: Secondary | ICD-10-CM | POA: Diagnosis not present

## 2023-02-16 DIAGNOSIS — R079 Chest pain, unspecified: Secondary | ICD-10-CM | POA: Diagnosis present

## 2023-02-16 LAB — CBC
HCT: 46.6 % (ref 39.0–52.0)
Hemoglobin: 15.4 g/dL (ref 13.0–17.0)
MCH: 29 pg (ref 26.0–34.0)
MCHC: 33 g/dL (ref 30.0–36.0)
MCV: 87.8 fL (ref 80.0–100.0)
Platelets: 244 10*3/uL (ref 150–400)
RBC: 5.31 MIL/uL (ref 4.22–5.81)
RDW: 13.5 % (ref 11.5–15.5)
WBC: 5.3 10*3/uL (ref 4.0–10.5)
nRBC: 0 % (ref 0.0–0.2)

## 2023-02-16 LAB — RESP PANEL BY RT-PCR (RSV, FLU A&B, COVID)  RVPGX2
Influenza A by PCR: POSITIVE — AB
Influenza B by PCR: NEGATIVE
Resp Syncytial Virus by PCR: NEGATIVE
SARS Coronavirus 2 by RT PCR: NEGATIVE

## 2023-02-16 LAB — BASIC METABOLIC PANEL
Anion gap: 8 (ref 5–15)
BUN: 12 mg/dL (ref 8–23)
CO2: 25 mmol/L (ref 22–32)
Calcium: 8.6 mg/dL — ABNORMAL LOW (ref 8.9–10.3)
Chloride: 104 mmol/L (ref 98–111)
Creatinine, Ser: 1.26 mg/dL — ABNORMAL HIGH (ref 0.61–1.24)
GFR, Estimated: >60 mL/min (ref >60)
Glucose, Bld: 107 mg/dL — ABNORMAL HIGH (ref 70–99)
Potassium: 4 mmol/L (ref 3.5–5.1)
Sodium: 137 mmol/L (ref 135–145)

## 2023-02-16 LAB — TROPONIN I (HIGH SENSITIVITY): Troponin I (High Sensitivity): 20 ng/L — ABNORMAL HIGH (ref <18)

## 2023-02-16 NOTE — ED Triage Notes (Signed)
PT complains of chest pain, nausea, SOB, fever and cough x 1 week.

## 2023-02-17 LAB — TROPONIN I (HIGH SENSITIVITY): Troponin I (High Sensitivity): 19 ng/L — ABNORMAL HIGH (ref <18)

## 2023-02-17 NOTE — ED Notes (Signed)
 Pt decided to leave before going to a room.

## 2023-08-14 ENCOUNTER — Encounter (HOSPITAL_COMMUNITY): Payer: Self-pay

## 2023-08-14 ENCOUNTER — Emergency Department (HOSPITAL_COMMUNITY)

## 2023-08-14 ENCOUNTER — Other Ambulatory Visit: Payer: Self-pay

## 2023-08-14 ENCOUNTER — Emergency Department (HOSPITAL_COMMUNITY)
Admission: EM | Admit: 2023-08-14 | Discharge: 2023-08-14 | Disposition: A | Discharge status: Home / Self Care | Attending: Emergency Medicine | Admitting: Emergency Medicine

## 2023-08-14 DIAGNOSIS — I160 Hypertensive urgency: Secondary | ICD-10-CM | POA: Diagnosis not present

## 2023-08-14 DIAGNOSIS — R079 Chest pain, unspecified: Secondary | ICD-10-CM | POA: Insufficient documentation

## 2023-08-14 DIAGNOSIS — Z79899 Other long term (current) drug therapy: Secondary | ICD-10-CM | POA: Insufficient documentation

## 2023-08-14 DIAGNOSIS — N189 Chronic kidney disease, unspecified: Secondary | ICD-10-CM

## 2023-08-14 LAB — BASIC METABOLIC PANEL WITH GFR
Anion gap: 7 (ref 5–15)
BUN: 24 mg/dL — ABNORMAL HIGH (ref 8–23)
CO2: 21 mmol/L — ABNORMAL LOW (ref 22–32)
Calcium: 8.6 mg/dL — ABNORMAL LOW (ref 8.9–10.3)
Chloride: 105 mmol/L (ref 98–111)
Creatinine, Ser: 1.64 mg/dL — ABNORMAL HIGH (ref 0.61–1.24)
GFR, Estimated: 46 mL/min — ABNORMAL LOW (ref >60)
Glucose, Bld: 102 mg/dL — ABNORMAL HIGH (ref 70–99)
Potassium: 4 mmol/L (ref 3.5–5.1)
Sodium: 133 mmol/L — ABNORMAL LOW (ref 135–145)

## 2023-08-14 LAB — CBC
HCT: 43.2 % (ref 39.0–52.0)
Hemoglobin: 14.5 g/dL (ref 13.0–17.0)
MCH: 29.2 pg (ref 26.0–34.0)
MCHC: 33.6 g/dL (ref 30.0–36.0)
MCV: 87.1 fL (ref 80.0–100.0)
Platelets: 268 K/uL (ref 150–400)
RBC: 4.96 MIL/uL (ref 4.22–5.81)
RDW: 13.6 % (ref 11.5–15.5)
WBC: 5.1 K/uL (ref 4.0–10.5)
nRBC: 0 % (ref 0.0–0.2)

## 2023-08-14 LAB — CBG MONITORING, ED: Glucose-Capillary: 91 mg/dL (ref 70–99)

## 2023-08-14 LAB — TROPONIN I (HIGH SENSITIVITY)
Troponin I (High Sensitivity): 15 ng/L (ref <18)
Troponin I (High Sensitivity): 13 ng/L (ref <18)

## 2023-08-14 MED ORDER — CARVEDILOL 3.125 MG PO TABS: 6.2500 mg | ORAL_TABLET | Freq: Two times a day (BID) | ORAL | Status: DC

## 2023-08-14 MED ORDER — SODIUM CHLORIDE 0.9 % IV BOLUS
1000.0000 mL | Freq: Once | INTRAVENOUS | Status: AC
  Administered 2023-08-14: 1000 mL via INTRAVENOUS

## 2023-08-14 MED ORDER — ACETAMINOPHEN 500 MG PO TABS: 1000.0000 mg | ORAL_TABLET | Freq: Once | ORAL | Status: DC

## 2023-08-14 MED ORDER — AMLODIPINE BESYLATE 5 MG PO TABS: 5.0000 mg | ORAL_TABLET | Freq: Every day | ORAL | 2 refills | Status: AC

## 2023-08-14 NOTE — ED Provider Triage Note (Signed)
 Emergency Medicine Provider Triage Evaluation Note  Curtis Griffin , a 68 y.o. male  was evaluated in triage.  Pt complains of days of HA and CP. No fall. Cp is now gone, so he can't described it.   Review of Systems  Positive: Cp Negative: Abd pain  Physical Exam  BP (!) 149/105   Pulse (!) 56   Temp 97.7 F (36.5 C) (Oral)   Resp 18   Ht 5' 11 (1.803 m)   Wt 106.6 kg   SpO2 98%   BMI 32.78 kg/m  Gen:   Awake, no distress   Resp:  Normal effort  MSK:   Moves extremities without difficulty  Other:    Medical Decision Making  Medically screening exam initiated at 8:29 AM.  Appropriate orders placed.  Ubaldo JONETTA Bone was informed that the remainder of the evaluation will be completed by another provider, this initial triage assessment does not replace that evaluation, and the importance of remaining in the ED until their evaluation is complete.     Shermon Warren SAILOR, PA-C 08/14/23 0830

## 2023-08-14 NOTE — ED Provider Notes (Signed)
 [place] EMERGENCY DEPARTMENT AT [facility] Provider Note   CSN: [id number] Arrival date & time: 08/14/23  9349     Patient presents with: Chest Pain and Hypertension   Curtis Griffin is a 68 y.o. male.  With a history of hypertension and cocaine use who presents to the ED for hypertension.  Patient went to a dentist visit yesterday was instructed to come here for further evaluation given concern for hypertension.  He has not been taking carvedilol .  Last used cocaine 1 week ago.  Reports dull headache that started 3 days ago and has persisted since the onset.  No sudden onset or thunderclap emergence of this headache.  Localized over the frontal region.  Reported some chest pain last night none currently.  No shortness of breath fevers chills nausea vomiting diaphoresis or abdominal pain.  He is followed by Bonni LIEN    Chest Pain Hypertension Associated symptoms include chest pain.       Prior to Admission medications   Medication Sig Start Date End Date Taking? Authorizing Provider  albuterol  (VENTOLIN  HFA) 108 (90 Base) MCG/ACT inhaler Inhale 2 puffs into the lungs every 6 (six) hours as needed for wheezing or shortness of breath. 10/29/20   Lilland, Alana, DO  carvedilol  (COREG ) 6.25 MG tablet Take 0.5 tablets (3.125 mg total) by mouth 2 (two) times daily. 12/10/21 01/09/22  Ward, Harlene PEDLAR, PA-C  Elastic Bandages & Supports (MEDICAL COMPRESSION STOCKINGS) MISC Wear daily and as needed for swelling of lower legs Patient not taking: No sig reported 08/19/19   Burky, Natalie B, NP  finasteride  (PROSCAR ) 5 MG tablet Take 1 tablet (5 mg total) by mouth daily. 12/10/21   Ward, Harlene PEDLAR, PA-C  losartan  (COZAAR ) 50 MG tablet Take 0.5-1 tablets (25-50 mg total) by mouth daily. 12/10/21 01/09/22  Ward, Harlene PEDLAR, PA-C  sildenafil (VIAGRA) 100 MG tablet Take 100 mg by mouth daily as needed for erectile dysfunction. 09/15/20   [provider]    Allergies:  Patient has no known allergies.    Review of Systems  Cardiovascular:  Positive for chest pain.    Updated Vital Signs BP (!) 149/98   Pulse (!) 51   Temp 97.8 F (36.6 C)   Resp 16   Ht 5' 11 (1.803 m)   Wt 106.6 kg   SpO2 100%   BMI 32.78 kg/m   Physical Exam Vitals and nursing note reviewed.  HENT:     Head: Normocephalic and atraumatic.  Eyes:     Pupils: Pupils are equal, round, and reactive to light.  Cardiovascular:     Rate and Rhythm: Normal rate and regular rhythm.  Pulmonary:     Effort: Pulmonary effort is normal.     Breath sounds: Normal breath sounds.  Abdominal:     Palpations: Abdomen is soft.     Tenderness: There is no abdominal tenderness.  Musculoskeletal:     Right lower leg: No edema.     Left lower leg: No edema.  Skin:    General: Skin is warm and dry.  Neurological:     General: No focal deficit present.     Mental Status: He is alert and oriented to person, place, and time.     Motor: No weakness.  Psychiatric:        Mood and Affect: Mood normal.     (all labs ordered are listed, but only abnormal results are displayed) Labs Reviewed  BASIC METABOLIC PANEL WITH GFR -  Abnormal; Notable for the following components:      Result Value   Sodium 133 (*)    CO2 21 (*)    Glucose, Bld 102 (*)    BUN 24 (*)    Creatinine, Ser 1.64 (*)    Calcium 8.6 (*)    GFR, Estimated 46 (*)    All other components within normal limits  CBC  CBG MONITORING, ED  TROPONIN I (HIGH SENSITIVITY)  TROPONIN I (HIGH SENSITIVITY)    EKG: EKG Interpretation Date/Time:  Wednesday August 14 2023 14:50:44 EDT Ventricular Rate:  43 PR Interval:  192 QRS Duration:  97 QT Interval:  473 QTC Calculation: 400 R Axis:   4  Text Interpretation: Sinus bradycardia Atrial premature complex Abnormal R-wave progression, early transition Repol abnrm, severe global ischemia (LM/MVD) No significant change since last tracing Confirmed by Yolande Charleston [phone]) on  08/14/2023 3:16:43 PM  Radiology: CT HEAD WO CONTRAST Result Date: 08/14/2023 CLINICAL DATA:  Headache, sudden, severe EXAM: CT HEAD WITHOUT CONTRAST TECHNIQUE: Contiguous axial images were obtained from the base of the skull through the vertex without intravenous contrast. RADIATION DOSE REDUCTION: This exam was performed according to the departmental dose-optimization program which includes automated exposure control, adjustment of the mA and/or kV according to patient size and/or use of iterative reconstruction technique. COMPARISON:  CT the head dated July 14, 2020. FINDINGS: Brain: Normal brain. No evidence of hemorrhage, mass, acute cortical infarct or hydrocephalus. Vascular: Negative. Skull: Intact and unremarkable. Sinuses/Orbits: Clear paranasal sinuses and mastoid air cells. Normal orbits. Other: None. IMPRESSION: Normal. Electronically Signed   By: Evalene Coho M.D.   On: 08/14/2023 08:15   DG Chest 2 View Result Date: 08/14/2023 EXAM: 2 VIEW(S) XRAY OF THE CHEST 08/14/2023 07:52:00 AM COMPARISON: 02/16/2023 CLINICAL HISTORY: Chest pain. Reason for exam: chest pain. Triage notes: Pt arrived from home via pov c/o chest pain 6/10 described as pressure x 3 to 4 days accompanied with HTN. Pt states that he has also been experiencing blurred vision and the worst headache of his life for the past 3 or 4 days. NIH =0. FINDINGS: LUNGS AND PLEURA: No focal pulmonary opacity. No pulmonary edema. No pleural effusion. No pneumothorax. HEART AND MEDIASTINUM: Sternotomy. Stable cardiomediastinal silhouette. BONES AND SOFT TISSUES: No acute osseous abnormality. IMPRESSION: 1. No acute cardiopulmonary pathology. Electronically signed by: Norman Gatlin MD 08/14/2023 08:06 AM EDT RP Workstation: [id number]     Procedures   Medications Ordered in the ED  acetaminophen  (TYLENOL ) tablet 1,000 mg (0 mg Oral Hold 08/14/23 1359)  sodium chloride  0.9 % bolus 1,000 mL (has no administration in time range)   carvedilol  (COREG ) tablet 6.25 mg (has no administration in time range)    Clinical Course as of 08/14/23 1530  Wed Aug 14, 2023  1510 Initial laboratory workup notable for AKI.  Initial troponin 15 awaiting delta CT head unremarkable.\ \ LILLETTE Ozell Marine DO, am transitioning care of this patient to the oncoming provider pending delta troponin, repeat metabolic panel after IV fluids, reevaluation and disposition [MP]  1515 Assumed care. 68 yo M who pw hypertension. Also had some chest pain recently and a headache. Initial troponin was 15. EKG with some signs of LVH and ST depressions. Does use cocaine. Hasn't used in 1 week. Headache is mild and gradual in onset. Renal function worse than prior. Will check repeat trop and chemistry.  [RP]  1517 Creatinine(!): 1.64 Baseline 1.26 [RP]    Clinical Course User Index [MP] Marine Ozell  A, DO [RP] Yolande Lamar BROCKS, MD                                 Medical Decision Making 68 year old male with history as above presenting to the ED given concern for hypertension headache chest pain of 3 days duration.  Has not been taking carvedilol .  Last used cocaine 1 week ago.  Hypertensive currently.  EKG consistent with prior EKGs with no new ischemic changes but will send for high sensitive troponin and delta troponin considering his risk factors including cocaine use and continue to monitor on telemetry.  Benign physical exam including neurologic exam.  Will look for end organ damage with laboratory workup and obtain CT head to look for any evidence of brain bleed.  Low suspicion for subarachnoid hemorrhage given gradual onset headache but will consider if patient develops any new neurologic symptoms here.  Amount and/or Complexity of Data Reviewed Labs: ordered. Decision-making details documented in ED Course. Radiology: ordered.  Risk Prescription drug management.        Final diagnoses:  Hypertensive urgency  Nonspecific chest pain     ED Discharge Orders     None          Pamella Ozell LABOR, DO 08/14/23 1530

## 2023-08-14 NOTE — ED Provider Notes (Signed)
  Physical Exam  BP (!) 144/102   Pulse (!) 47   Temp (!) 97.3 F (36.3 C) (Oral)   Resp 18   Ht 5' 11 (1.803 m)   Wt 106.6 kg   SpO2 97%   BMI 32.78 kg/m   Physical Exam  Procedures  .Ultrasound ED Peripheral IV (Provider)  Date/Time: 08/14/2023 3:30 PM  Performed by: Yolande Lamar BROCKS, MD Authorized by: Yolande Lamar BROCKS, MD   Procedure details:    Indications: multiple failed IV attempts and poor IV access     Skin Prep: chlorhexidine gluconate     Location:  Left AC   Angiocath:  20 G   Bedside Ultrasound Guided: Yes     Images: not archived     Patient tolerated procedure without complications: Yes     Dressing applied: Yes     ED Course / MDM   Clinical Course as of 08/14/23 1634  Wed Aug 14, 2023  1510 Initial laboratory workup notable for AKI.  Initial troponin 15 awaiting delta CT head unremarkable.\ \ LILLETTE Ozell Marine DO, am transitioning care of this patient to the oncoming provider pending delta troponin, repeat metabolic panel after IV fluids, reevaluation and disposition [MP]  1515 Assumed care. 68 yo M hx of alcohol use, cocaine dependence, and chest pain who pw hypertension from the dentist. Also had some chest pain recently and a headache. Initial troponin was 15. EKG with some signs of LVH and ST depressions. Does use cocaine. Hasn't used in 1 week. Headache is mild and gradual in onset. Renal function worse than prior. Will check repeat trop and chemistry.  [RP]  1517 Creatinine(!): 1.64 Baseline 1.26 [RP]  1633 Patient reassessed.  He is overall well-appearing.  No significant chest pain.  He is getting IV fluids at this time.  His repeat troponin was 13 down from 15.  Do not feel that ACS is likely given his normal troponins and the fact that he is not currently having chest pain.  Head CT was unremarkable.  Will start the patient on amlodipine  because of his cocaine use.  Counseled to stop using cocaine.  Because of his reduced renal function I did  also counsel him to follow-up with his primary doctor in a few days for repeat creatinine check. [RP]    Clinical Course User Index [MP] Marine Ozell LABOR, DO [RP] Yolande Lamar BROCKS, MD   Medical Decision Making Amount and/or Complexity of Data Reviewed Labs: ordered. Decision-making details documented in ED Course. Radiology: ordered.  Risk Prescription drug management.      Yolande Lamar BROCKS, MD 08/14/23 [phone]

## 2023-08-14 NOTE — ED Triage Notes (Signed)
 Pt arrived from home via pov c/o chest pain 6/10 described as pressure x 3 ro 4 days accompanied with HTN. Pt states that he has also been experiencing blurred vision and the worst headache of his life for the past 3 or 4 days. NIH =0

## 2023-08-14 NOTE — Discharge Instructions (Addendum)
 You were seen for your high blood pressure (hypertension) in the emergency department.   At home, please start taking the amlodipine  and stop taking the Coreg  for your blood pressure.  Continue all other blood pressure medications that you are prescribed  Do not use cocaine anymore because it is bad for your health.  Check your MyChart online for the results of any tests that had not resulted by the time you left the emergency department.   Follow-up with your primary doctor in 2-3 days regarding your visit.  Please talk to them about having your kidney function rechecked.  Return immediately to the emergency department if you experience any of the following: Severe headache, vision changes, numbness or weakness of your arms or legs, difficulty breathing, chest pain, or any other concerning symptoms.    Thank you for visiting our Emergency Department. It was a pleasure taking care of you today.
